# Patient Record
Sex: Female | Born: 1995 | Race: White | Hispanic: No | Marital: Single | Smoking: Never smoker
Health system: Southern US, Community
[De-identification: ages and names within clinical notes are randomized; demographics above are authoritative.]

## PROBLEM LIST (undated history)

## (undated) DIAGNOSIS — R51 Headache: Secondary | ICD-10-CM

## (undated) DIAGNOSIS — L209 Atopic dermatitis, unspecified: Secondary | ICD-10-CM

## (undated) DIAGNOSIS — R519 Headache, unspecified: Secondary | ICD-10-CM

## (undated) DIAGNOSIS — M6289 Other specified disorders of muscle: Secondary | ICD-10-CM

## (undated) DIAGNOSIS — N3010 Interstitial cystitis (chronic) without hematuria: Secondary | ICD-10-CM

## (undated) HISTORY — DX: Headache, unspecified: R51.9

## (undated) HISTORY — DX: Interstitial cystitis (chronic) without hematuria: N30.10

## (undated) HISTORY — DX: Headache: R51

## (undated) HISTORY — PX: DENTAL SURGERY: SHX609

## (undated) HISTORY — PX: NO PAST SURGERIES: SHX2092

## (undated) HISTORY — DX: Atopic dermatitis, unspecified: L20.9

---
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
## 2013-01-04 DIAGNOSIS — L209 Atopic dermatitis, unspecified: Secondary | ICD-10-CM | POA: Insufficient documentation

## 2013-07-18 DIAGNOSIS — N3010 Interstitial cystitis (chronic) without hematuria: Secondary | ICD-10-CM | POA: Insufficient documentation

## 2013-12-24 ENCOUNTER — Encounter (INDEPENDENT_AMBULATORY_CARE_PROVIDER_SITE_OTHER): Payer: Self-pay

## 2013-12-24 ENCOUNTER — Encounter: Payer: Self-pay | Admitting: Neurology

## 2013-12-24 ENCOUNTER — Ambulatory Visit (INDEPENDENT_AMBULATORY_CARE_PROVIDER_SITE_OTHER): Payer: BC Managed Care – HMO | Admitting: Neurology

## 2013-12-24 VITALS — BP 89/57 | HR 69 | Ht 61.0 in | Wt 111.2 lb

## 2013-12-24 DIAGNOSIS — G43909 Migraine, unspecified, not intractable, without status migrainosus: Secondary | ICD-10-CM | POA: Insufficient documentation

## 2013-12-24 DIAGNOSIS — G43709 Chronic migraine without aura, not intractable, without status migrainosus: Secondary | ICD-10-CM

## 2013-12-24 MED ORDER — PROPRANOLOL HCL ER 80 MG PO CP24: 80.0000 mg | ORAL_CAPSULE | Freq: Every day | ORAL | Status: DC

## 2013-12-24 NOTE — Progress Notes (Signed)
[facility]    Provider:  Dr Lucia GaskinsAhern Referring Provider: Haze Rushingobb, Deborah, MD Primary Care Physician:  Haze RushingOBB, DEBORAH, MD  CC:  Headaches  HPI:  Kayla CallerKristin Bingley is a 18 y.o. female here as a referral from Dr. Allison Quarryobb for Headaches. They are daily and worsening. Worse near the end the day. Has always had headaches since childhood, both grandmothers have headaches. Pressure and throbbing mostly in the front forehead, neck gets tense. She has knots in her shoulders. Has light sensitivity, some sounds sensitivity, some nausea, no vomiting. Feels sick after she eats. The headache can last for 6  hours. Hot packs on neck, massanging temples makes it better and ibuprofen. Taking ibuprofen daily. They can get up to 7-8/10 severe. Has to put on eye masks and darkness. Also throbbing like a tiny person trying to jump out of head. These have been going on since childhood and worsened after started period, progressively worsening. No aura. Her fingers tingle during the headache. Can fall asleep, but always tired. No snoring. No vision or hearing changes or other focal neurologic symptoms during the headaches.   Reviewed notes, labs and imaging from outside physicians, which showed: Has been told HAs were associated with sinus infections in the past and prescribed antibiotics, no weakness or vision loss, symptoms similar to previous headaches just worsening, also with scalp tenderness, no systemic symptoms like fever or chills, she has a PMHx of eczema, interstitial cystitis, chronic sinusitis. She has been prescribed a triptan in the past.   Review of Systems: Patient complains of symptoms per HPI as well as the following symptoms easy brusing, feeling hot, feeling cold, joint pain, aching muscles, allergies, birth marks, skin sensitivity, sleepiness, too much sleep, not enough energy. Pertinent negatives per HPI. All others negative.   History   Social History  . Marital Status: Unknown    Spouse Name: N/A    Number of Children: 0  . Years of Education: College   Occupational History  . waitress Other    [employer]  . cashier Other    [employer]   Social History Main Topics  . Smoking status: Never Smoker   . Smokeless tobacco: Never Used  . Alcohol Use: Yes     Comment: once a week  . Drug Use: No  . Sexual Activity: Not on file   Other Topics Concern  . Not on file   Social History Narrative   Patient lives at home with boyfriend   Caffeine use: none    Family History  Problem Relation Age of Onset  . Bipolar disorder Mother   . Other Father     back problems  . Heart Problems Father     Past Medical History  Diagnosis Date  . Atopic dermatitis   . Interstitial cystitis     Past Surgical History  Procedure Laterality Date  . No past surgeries      Current Outpatient Prescriptions  Medication Sig Dispense Refill  . cetirizine (ZYRTEC) 10 MG tablet Take 10 mg by mouth daily.      . Cholecalciferol (VITAMIN D-3) 1000 UNITS CAPS Take 1 capsule by mouth daily.      Marland Kitchen. levonorgestrel-ethinyl estradiol (NORDETTE) 0.15-30 MG-MCG tablet Take 1 tablet by mouth daily.      . mometasone (ELOCON) 0.1 % cream Apply 1 application topically daily as needed.      . propranolol ER (INDERAL LA) 80 MG 24 hr capsule Take 1 capsule (80 mg total) by  mouth daily.  30 capsule  3  . triamcinolone (KENALOG) 0.025 % ointment Apply 1 application topically 2 (two) times daily.       No current facility-administered medications for this visit.    Allergies as of 12/24/2013 - Review Complete 12/24/2013  Allergen Reaction Noted  . Amoxicillin  12/24/2013  . Latex  12/24/2013  . Penicillins  12/24/2013  . Sulfa antibiotics  12/24/2013    Vitals: BP 89/57  Pulse 69  Ht 5\' 1"  (1.549 m)  Wt 111 lb 3.2 oz (50.44 kg)  BMI 21.02 kg/m2 Last Weight:  Wt Readings from Last 1 Encounters:  12/24/13 111 lb 3.2 oz (50.44 kg)   Last Height:   Ht Readings from Last 1  Encounters:  12/24/13 5\' 1"  (1.549 m)   Physical exam: Exam: Gen: NAD, conversant, well nourised, well groomed                     CV: RRR, no MRG. No Carotid Bruits. No peripheral edema, warm, nontender Eyes: Conjunctivae clear without exudates or hemorrhage  Neuro: Detailed Neurologic Exam  Speech:    Speech is normal; fluent and spontaneous with normal comprehension.  Cognition:    The patient is oriented to person, place, and time;     recent and remote memory intact;     language fluent;     normal attention, concentration,     fund of knowledge Cranial Nerves:    The pupils are equal, round, and reactive to light. The fundi are normal and spontaneous venous pulsations are present. Visual fields are full to finger confrontation. Extraocular movements are intact. Trigeminal sensation is intact and the muscles of mastication are normal. The face is symmetric. The palate elevates in the midline. Voice is normal. Shoulder shrug is normal. The tongue has normal motion without fasciculations.   Coordination:    Normal finger to nose and heel to shin. Normal rapid alternating movements.   Gait:    Heel-toe and tandem gait are normal.   Motor Observation:    No asymmetry, no atrophy, and no involuntary movements noted. Tone:    Normal muscle tone.    Posture:    Posture is normal. normal erect    Strength:    Strength is V/V in the upper and lower limbs.      Sensation: intact     Reflex Exam:  DTR's:    Deep tendon reflexes in the upper and lower extremities are normal bilaterally.   Toes:    The toes are downgoing bilaterally.   Clonus:    Clonus is absent.       Assessment/Plan:  18 year old with chronic migraines syndrome. Will try Butterbur 75mg  bid. If that doesn't work, also prescribed propranolol 80mg  er daily and discussed side effects and to monitor BP and pulse and discussed teratogenic effects. Discussed coming back for trigger point injections as well  as botox in the future. Also suggested Riboflavin and Magnesium daily. MRI of the brain bc she has never had brain imaging. Her GI issues can be associated with migraines as gastroparesis is part of a migraine syndrome. Also discussed rebound effect, do not take ibuprofen more than 2 days a week.  Discussed following:  To prevent or relieve headaches, try the following: Cool Compress. Lie down and place a cool compress on your head.  Avoid headache triggers. If certain foods or odors seem to have triggered your migraines in the past, avoid them. A headache  diary might help you identify triggers.  Include physical activity in your daily routine. Try a daily walk or other moderate aerobic exercise.  Manage stress. Find healthy ways to cope with the stressors, such as delegating tasks on your to-do list.  Practice relaxation techniques. Try deep breathing, yoga, massage and visualization.  Eat regularly. Eating regularly scheduled meals and maintaining a healthy diet might help prevent headaches. Also, drink plenty of fluids.  Follow a regular sleep schedule. Sleep deprivation might contribute to headaches Consider biofeedback. With this mind-body technique, you learn to control certain bodily functions - such as muscle tension, heart rate and blood pressure - to prevent headaches or reduce headache pain.    Proceed to emergency room if you experience new or worsening symptoms or symptoms do not resolve, if you have new neurologic symptoms or if headache is severe, or for any concerning symptom.   Naomie DeanAntonia Orel Cooler, MD  [facility] [address] [place], [place] [id number]  Phone [phone] Fax [phone]

## 2013-12-24 NOTE — Patient Instructions (Addendum)
Overall you are doing fairly well but I do want to suggest a few things today:   Remember to drink plenty of fluid, eat healthy meals and do not skip any meals. Try to eat protein with a every meal and eat a healthy snack such as fruit or nuts in between meals. Try to keep a regular sleep-wake schedule and try to exercise daily, particularly in the form of walking, 20-30 minutes a day, if you can.   As far as your medications are concerned, I would like to suggest; propranolol 80mg  daily. Monitor blood pressure, pulse, stop for dizziness or any other severe side effect. Do not get pregnant while taking medications until discussing with physician. Butterbur 75mg  BID.   As far as diagnostic testing: MRi of the brain  I would like to see you back in 3 months, sooner if we need to. Please call us with any interim questions, concerns, problems, updates or refill requests.   Please also call us for any test results so we can go over those with you on the phone.  My clinical assistant and will answer any of your questions and relay your messages to me and also relay most of my messages to you.   Our phone number is [phone]. We also have an after hours call service for urgent matters and there is a physician on-call for urgent questions. For any emergencies you know to call 911 or go to the nearest emergency room

## 2013-12-31 ENCOUNTER — Ambulatory Visit: Payer: BC Managed Care – PPO | Attending: Obstetrics & Gynecology | Admitting: Physical Therapy

## 2013-12-31 DIAGNOSIS — N941 Dyspareunia: Secondary | ICD-10-CM | POA: Diagnosis not present

## 2013-12-31 DIAGNOSIS — M62838 Other muscle spasm: Secondary | ICD-10-CM | POA: Diagnosis not present

## 2013-12-31 DIAGNOSIS — R102 Pelvic and perineal pain: Secondary | ICD-10-CM | POA: Diagnosis not present

## 2013-12-31 DIAGNOSIS — Z5189 Encounter for other specified aftercare: Secondary | ICD-10-CM | POA: Diagnosis present

## 2013-12-31 DIAGNOSIS — L209 Atopic dermatitis, unspecified: Secondary | ICD-10-CM | POA: Insufficient documentation

## 2013-12-31 DIAGNOSIS — N3010 Interstitial cystitis (chronic) without hematuria: Secondary | ICD-10-CM | POA: Diagnosis not present

## 2014-01-01 ENCOUNTER — Ambulatory Visit: Payer: BC Managed Care – PPO | Admitting: Physical Therapy

## 2014-01-01 DIAGNOSIS — Z5189 Encounter for other specified aftercare: Secondary | ICD-10-CM | POA: Diagnosis not present

## 2014-01-07 ENCOUNTER — Ambulatory Visit: Payer: BC Managed Care – PPO | Admitting: Physical Therapy

## 2014-01-07 DIAGNOSIS — Z5189 Encounter for other specified aftercare: Secondary | ICD-10-CM | POA: Diagnosis not present

## 2014-01-11 ENCOUNTER — Ambulatory Visit: Payer: BC Managed Care – PPO | Admitting: Physical Therapy

## 2014-01-11 DIAGNOSIS — Z5189 Encounter for other specified aftercare: Secondary | ICD-10-CM | POA: Diagnosis not present

## 2014-01-14 ENCOUNTER — Ambulatory Visit: Payer: BC Managed Care – PPO | Attending: Obstetrics & Gynecology | Admitting: Physical Therapy

## 2014-01-14 DIAGNOSIS — R102 Pelvic and perineal pain: Secondary | ICD-10-CM | POA: Diagnosis not present

## 2014-01-14 DIAGNOSIS — L209 Atopic dermatitis, unspecified: Secondary | ICD-10-CM | POA: Diagnosis not present

## 2014-01-14 DIAGNOSIS — N941 Dyspareunia: Secondary | ICD-10-CM | POA: Insufficient documentation

## 2014-01-14 DIAGNOSIS — M62838 Other muscle spasm: Secondary | ICD-10-CM | POA: Insufficient documentation

## 2014-01-14 DIAGNOSIS — N3010 Interstitial cystitis (chronic) without hematuria: Secondary | ICD-10-CM | POA: Insufficient documentation

## 2014-01-14 DIAGNOSIS — Z5189 Encounter for other specified aftercare: Secondary | ICD-10-CM | POA: Insufficient documentation

## 2014-01-15 ENCOUNTER — Ambulatory Visit: Payer: BC Managed Care – PPO | Admitting: Physical Therapy

## 2014-01-15 DIAGNOSIS — Z5189 Encounter for other specified aftercare: Secondary | ICD-10-CM | POA: Diagnosis not present

## 2014-01-21 ENCOUNTER — Ambulatory Visit: Payer: BC Managed Care – PPO | Admitting: Physical Therapy

## 2014-01-23 ENCOUNTER — Ambulatory Visit: Payer: BC Managed Care – PPO | Admitting: Physical Therapy

## 2014-01-23 DIAGNOSIS — Z5189 Encounter for other specified aftercare: Secondary | ICD-10-CM | POA: Diagnosis not present

## 2014-01-28 ENCOUNTER — Ambulatory Visit: Payer: BC Managed Care – PPO | Admitting: Physical Therapy

## 2014-01-28 DIAGNOSIS — Z5189 Encounter for other specified aftercare: Secondary | ICD-10-CM | POA: Diagnosis not present

## 2014-01-31 ENCOUNTER — Ambulatory Visit: Payer: BC Managed Care – PPO | Admitting: Physical Therapy

## 2014-02-01 ENCOUNTER — Ambulatory Visit: Payer: BC Managed Care – PPO | Admitting: Physical Therapy

## 2014-02-01 DIAGNOSIS — Z5189 Encounter for other specified aftercare: Secondary | ICD-10-CM | POA: Diagnosis not present

## 2014-02-04 ENCOUNTER — Ambulatory Visit: Payer: BC Managed Care – PPO | Admitting: Physical Therapy

## 2014-02-04 DIAGNOSIS — Z5189 Encounter for other specified aftercare: Secondary | ICD-10-CM | POA: Diagnosis not present

## 2014-02-14 ENCOUNTER — Ambulatory Visit: Payer: BC Managed Care – PPO | Attending: Obstetrics & Gynecology | Admitting: Physical Therapy

## 2014-02-14 DIAGNOSIS — N941 Dyspareunia: Secondary | ICD-10-CM | POA: Insufficient documentation

## 2014-02-14 DIAGNOSIS — R102 Pelvic and perineal pain: Secondary | ICD-10-CM | POA: Insufficient documentation

## 2014-02-14 DIAGNOSIS — M62838 Other muscle spasm: Secondary | ICD-10-CM | POA: Diagnosis not present

## 2014-02-14 DIAGNOSIS — L209 Atopic dermatitis, unspecified: Secondary | ICD-10-CM | POA: Insufficient documentation

## 2014-02-14 DIAGNOSIS — Z5189 Encounter for other specified aftercare: Secondary | ICD-10-CM | POA: Insufficient documentation

## 2014-02-14 DIAGNOSIS — N3010 Interstitial cystitis (chronic) without hematuria: Secondary | ICD-10-CM | POA: Insufficient documentation

## 2014-02-18 ENCOUNTER — Ambulatory Visit: Payer: BC Managed Care – PPO | Admitting: Physical Therapy

## 2014-02-22 ENCOUNTER — Ambulatory Visit: Payer: BC Managed Care – PPO | Admitting: Physical Therapy

## 2014-02-22 DIAGNOSIS — Z5189 Encounter for other specified aftercare: Secondary | ICD-10-CM | POA: Diagnosis not present

## 2014-02-27 ENCOUNTER — Ambulatory Visit: Payer: BC Managed Care – PPO | Admitting: Physical Therapy

## 2014-02-27 DIAGNOSIS — Z5189 Encounter for other specified aftercare: Secondary | ICD-10-CM | POA: Diagnosis not present

## 2014-03-06 ENCOUNTER — Ambulatory Visit: Payer: BC Managed Care – PPO | Admitting: Physical Therapy

## 2014-03-06 DIAGNOSIS — Z5189 Encounter for other specified aftercare: Secondary | ICD-10-CM | POA: Diagnosis not present

## 2014-03-11 ENCOUNTER — Ambulatory Visit: Payer: BC Managed Care – PPO | Admitting: Physical Therapy

## 2014-03-11 DIAGNOSIS — Z5189 Encounter for other specified aftercare: Secondary | ICD-10-CM | POA: Diagnosis not present

## 2014-03-19 ENCOUNTER — Ambulatory Visit: Payer: BLUE CROSS/BLUE SHIELD | Attending: Obstetrics & Gynecology | Admitting: Physical Therapy

## 2014-03-19 DIAGNOSIS — Z5189 Encounter for other specified aftercare: Secondary | ICD-10-CM | POA: Insufficient documentation

## 2014-03-19 DIAGNOSIS — N941 Dyspareunia: Secondary | ICD-10-CM | POA: Diagnosis not present

## 2014-03-19 DIAGNOSIS — L209 Atopic dermatitis, unspecified: Secondary | ICD-10-CM | POA: Insufficient documentation

## 2014-03-19 DIAGNOSIS — N3010 Interstitial cystitis (chronic) without hematuria: Secondary | ICD-10-CM | POA: Insufficient documentation

## 2014-03-19 DIAGNOSIS — R102 Pelvic and perineal pain: Secondary | ICD-10-CM | POA: Diagnosis not present

## 2014-03-19 DIAGNOSIS — M62838 Other muscle spasm: Secondary | ICD-10-CM | POA: Insufficient documentation

## 2014-03-26 ENCOUNTER — Ambulatory Visit: Payer: BLUE CROSS/BLUE SHIELD | Admitting: Physical Therapy

## 2014-03-26 ENCOUNTER — Ambulatory Visit: Payer: BC Managed Care – HMO | Admitting: Neurology

## 2014-03-29 ENCOUNTER — Ambulatory Visit: Payer: BLUE CROSS/BLUE SHIELD | Admitting: Physical Therapy

## 2014-03-29 DIAGNOSIS — Z5189 Encounter for other specified aftercare: Secondary | ICD-10-CM | POA: Diagnosis not present

## 2014-04-02 ENCOUNTER — Ambulatory Visit: Payer: BLUE CROSS/BLUE SHIELD | Admitting: Physical Therapy

## 2014-04-02 DIAGNOSIS — Z5189 Encounter for other specified aftercare: Secondary | ICD-10-CM | POA: Diagnosis not present

## 2014-04-11 ENCOUNTER — Ambulatory Visit: Payer: BLUE CROSS/BLUE SHIELD | Admitting: Physical Therapy

## 2014-04-11 DIAGNOSIS — Z5189 Encounter for other specified aftercare: Secondary | ICD-10-CM | POA: Diagnosis not present

## 2014-04-16 ENCOUNTER — Ambulatory Visit: Payer: BLUE CROSS/BLUE SHIELD | Attending: Obstetrics & Gynecology | Admitting: Physical Therapy

## 2014-04-16 DIAGNOSIS — N3010 Interstitial cystitis (chronic) without hematuria: Secondary | ICD-10-CM | POA: Insufficient documentation

## 2014-04-16 DIAGNOSIS — L209 Atopic dermatitis, unspecified: Secondary | ICD-10-CM | POA: Insufficient documentation

## 2014-04-16 DIAGNOSIS — M62838 Other muscle spasm: Secondary | ICD-10-CM | POA: Insufficient documentation

## 2014-04-16 DIAGNOSIS — R102 Pelvic and perineal pain: Secondary | ICD-10-CM | POA: Insufficient documentation

## 2014-04-16 DIAGNOSIS — N941 Dyspareunia: Secondary | ICD-10-CM | POA: Insufficient documentation

## 2014-04-16 DIAGNOSIS — Z5189 Encounter for other specified aftercare: Secondary | ICD-10-CM | POA: Insufficient documentation

## 2014-04-24 ENCOUNTER — Ambulatory Visit: Payer: BLUE CROSS/BLUE SHIELD | Admitting: Physical Therapy

## 2014-04-24 DIAGNOSIS — Z5189 Encounter for other specified aftercare: Secondary | ICD-10-CM | POA: Diagnosis not present

## 2014-04-24 DIAGNOSIS — M62838 Other muscle spasm: Secondary | ICD-10-CM | POA: Diagnosis not present

## 2014-04-24 DIAGNOSIS — R102 Pelvic and perineal pain: Secondary | ICD-10-CM | POA: Diagnosis not present

## 2014-04-24 DIAGNOSIS — N3010 Interstitial cystitis (chronic) without hematuria: Secondary | ICD-10-CM | POA: Diagnosis not present

## 2014-04-24 DIAGNOSIS — L209 Atopic dermatitis, unspecified: Secondary | ICD-10-CM | POA: Diagnosis not present

## 2014-04-24 DIAGNOSIS — N941 Dyspareunia: Secondary | ICD-10-CM | POA: Diagnosis not present

## 2014-05-10 ENCOUNTER — Ambulatory Visit: Payer: BLUE CROSS/BLUE SHIELD | Admitting: Physical Therapy

## 2014-05-17 ENCOUNTER — Ambulatory Visit: Payer: BLUE CROSS/BLUE SHIELD | Attending: Obstetrics & Gynecology | Admitting: Physical Therapy

## 2014-05-17 ENCOUNTER — Encounter: Payer: Self-pay | Admitting: Physical Therapy

## 2014-05-17 DIAGNOSIS — N941 Dyspareunia: Secondary | ICD-10-CM | POA: Diagnosis not present

## 2014-05-17 DIAGNOSIS — R102 Pelvic and perineal pain: Secondary | ICD-10-CM | POA: Diagnosis not present

## 2014-05-17 DIAGNOSIS — L209 Atopic dermatitis, unspecified: Secondary | ICD-10-CM | POA: Insufficient documentation

## 2014-05-17 DIAGNOSIS — N3010 Interstitial cystitis (chronic) without hematuria: Secondary | ICD-10-CM | POA: Diagnosis not present

## 2014-05-17 DIAGNOSIS — M62838 Other muscle spasm: Secondary | ICD-10-CM

## 2014-05-17 DIAGNOSIS — Z5189 Encounter for other specified aftercare: Secondary | ICD-10-CM | POA: Insufficient documentation

## 2014-05-17 DIAGNOSIS — M25551 Pain in right hip: Secondary | ICD-10-CM

## 2014-05-17 NOTE — Therapy (Signed)
[facility] [address], [address] [place], [place], [address] Phone: [phone]   Fax:  [phone]  Physical Therapy Treatment  Patient Details  Name: Kayla Gamble MRN: [id number] Date of Birth: 1995/04/10 Referring Provider:  Mitchel Honour, DO  Encounter Date: 05/17/2014      PT End of Session - 05/17/14 0913    Visit Number 10   Date for PT Re-Evaluation 05/20/14   PT Start Time 0800   PT Stop Time 0900   PT Time Calculation (min) 60 min   Activity Tolerance Patient tolerated treatment well   Behavior During Therapy [facility] for tasks assessed/performed      Past Medical History  Diagnosis Date  . Atopic dermatitis   . Interstitial cystitis   . Headache     Past Surgical History  Procedure Laterality Date  . No past surgeries      There were no vitals taken for this visit.  Visit Diagnosis:  Pain in joint, pelvic region and thigh, right  Muscle spasm      Subjective Assessment - 05/17/14 0812    Symptoms Patient reports she has not had pain in 2 weeks.  Patient was able to have have intercourse without pain.  Patient has stopped her birth contol and has not had migraines or pelvic pain since then. Biggest size dilator.    How long can you sit comfortably? no pain   How long can you stand comfortably? no pain   How long can you walk comfortably? no pain   Patient Stated Goals reduce or abolish pain   Currently in Pain? No/denies   Multiple Pain Sites No          [facility] PT Assessment - 05/17/14 0001    Assessment   Medical Diagnosis Dyspareunia; Pelvic pain   Onset Date 03/15/13   Prior Therapy None   Precautions   Precautions None   Balance Screen   Has the patient fallen in the past 6 months No   Has the patient had a decrease in activity level because of a fear of falling?  No   Is the patient reluctant to leave their home because of a fear of falling?  No   Observation/Other Assessments    Other Surveys  Select  Female NIH-CPSI score for pain initially 14 pts. today is 0    Strength   Left Hip ABduction 4/5   Left Hip ADduction 4/5   Palpation   Palpation pelvis in correct alignment.                   [facility] Adult PT Treatment/Exercise - 05/17/14 0001    Balance Poses: Yoga   Warrior I 2 reps   Lumbar Exercises: Stretches   Quadruped Mid Back Stretch 3 reps  cat/camet   Knee/Hip Exercises: Stretches   Active Hamstring Stretch 1 rep;30 seconds  hands on wall with flat back and knees extended   Passive Hamstring Stretch 1 rep;30 seconds  on back with legs on wall, hamstring, then adductor stretch   Piriformis Stretch 1 rep;30 seconds   Piriformis Stretch Limitations --  pirformed in sitting with trunk rotation   Knee/Hip Exercises: Sidelying   Hip ADduction AROM;Right;Left;1 set;5 sets   Hip ADduction Limitations --  verbal cues for position and no hip flexion      See patient instruction for physio ball exercises that were given to patient.            PT  Education - 05/17/14 0912    Education provided Yes   Education Details Physio ball stretches   Person(s) Educated Patient   Methods Explanation;Demonstration;Verbal cues;Handout   Comprehension Verbalized understanding;Returned demonstration          PT Short Term Goals - 05/17/14 [id number]    PT SHORT TERM GOAL #1   Title be independent with initial HEP for stretching within pain tolerance   Time 4   Period Weeks   Status Achieved   PT SHORT TERM GOAL #2   Title understand ho to use a Q-tip to insert into vaginal with reduced pain   Time 4   Period Weeks   Status Achieved   PT SHORT TERM GOAL #3   Title understand how to perform diaphragmatic breathing to relax pelvic floor   Time 4   Period Weeks   Status Achieved   PT SHORT TERM GOAL #4   Title perform self perineal massage to improve mobility of pelvic floor muscles   Time 4   Period Weeks   Status Achieved            PT Long Term Goals - 05/17/14 0820    PT LONG TERM GOAL #1   Title demonstate and /or verbalize techniques to reduce the risk of re-injury to include info on posture   Time 8   Period Weeks   Status Achieved   PT LONG TERM GOAL #2   Title be independent withadvanced HEP for pelvic floor relaxtion   Time 8   Period Weeks   Status Achieved   PT LONG TERM GOAL #3   Title Marinoff scale  is </= 1/3   Time 8   Period Weeks   Status Achieved  able to have intercourse   PT LONG TERM GOAL #4   Title return to yoga with minimal pelvic pain   Time 8   Period Weeks   Status Achieved   PT LONG TERM GOAL #5   Title wear tampon with minimal pian   Time 8   Period Weeks   Status Achieved               Plan - 05/17/14 0913    Clinical Impression Statement Patient has achieved all goals and no pain since she stopped taking her birthcontrol pills. Patient continues to have weakness in bilateral hip adductors and abductors.    Pt will benefit from skilled therapeutic intervention in order to improve on the following deficits Pain;Decreased strength;Decreased mobility;Decreased endurance;Decreased coordination;Impaired flexibility   Rehab Potential Good   Clinical Impairments Affecting Rehab Potential None   PT Frequency 2x / week   PT Duration 8 weeks   PT Treatment/Interventions Moist Heat;Therapeutic activities;Patient/family education;Therapeutic exercise;Biofeedback;Ultrasound;Manual techniques;Neuromuscular re-education;Cryotherapy;Electrical Stimulation;Functional mobility training   PT Next Visit Plan if patient continues to feel well then discharge in 2 weeks.   Recommended Other Services None   Consulted and Agree with Plan of Care Patient        Problem List Patient Active Problem List   Diagnosis Date Noted  . Migraine 12/24/2013    Travelle Mcclimans, PT 05/17/2014, 9:18 AM  [facility] [address] [place], [place], [address] Phone: [phone]   Fax:  [phone]

## 2014-05-17 NOTE — Patient Instructions (Signed)
Ball Roll (Prone)   Lie with abdomen on ball in a squatting position.  Straighten legs partially and roll forward. Repeat 10___ times. Do __1_ times a day.   Copyright  VHI. All rights reserved.  Leg Pull (Supine)   Lie flat with calves supported by ball. Squeeze pelvic floor and hold. Pull ball in, then push away. Relax. Repeat _10__ times. Do _1__ times a day. Advanced: Hold squeeze through all repetitions.    Copyright  VHI. All rights reserved.  Diagonal (Sitting)   Sit on ball.  Guiding with legs, move ball diagonally to left side. Relax. Hold 15 seconds Repeat _2__ times. Do ___ 1times a day. Alternate: Form a "V" by moving diagonal to one side then to other. Advanced: Continue to hold squeeze through repetitions.   Copyright  VHI. All rights reserved.  Side Tilt (Sitting)   Sit on ball.  Roll ball slightly to one side by tilting pelvis the opposite direction. Repeat to other side. Relax. Repeat 10___ times. Do _1__ times a day. Advanced: Continue to hold squeeze through repetitions.  Copyright  VHI. All rights reserved.  Bouncing With Bracing (Sitting)   Sit on ball. Find neutral spine.  Gently bounce up and down for _1__ seconds. Repeat _10__ times. Do __1_ times a day.  Copyright  VHI. All rights reserved.  Patient able to return demonstration correctly for the above exercises.

## 2014-05-18 IMAGING — US US ABDOMEN COMPLETE
1 series · 14 of 25 positions shown · non-contrast
Comparison: None.

CLINICAL DATA: Right upper quadrant abdominal pain. History of
cystitis. Initial encounter.

EXAM:
ULTRASOUND ABDOMEN COMPLETE

[Series 1: us abdomen complete · 0.26mm/px · 14 of 47 slices shown]
[im 1/47]
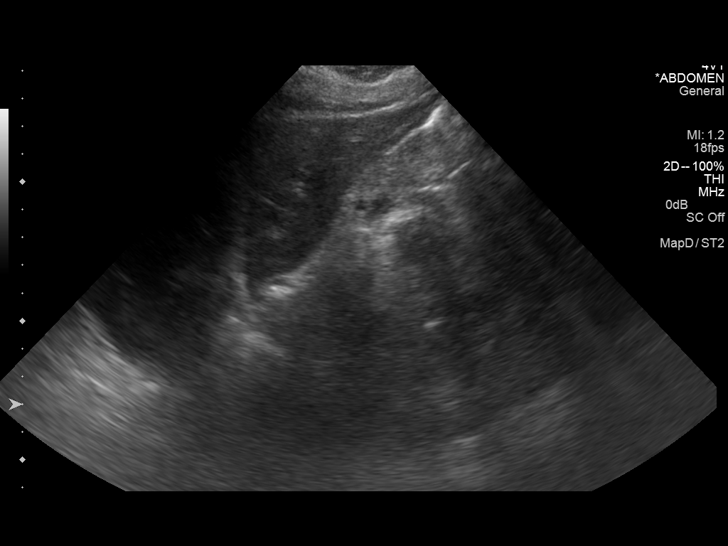
[im 4/47]
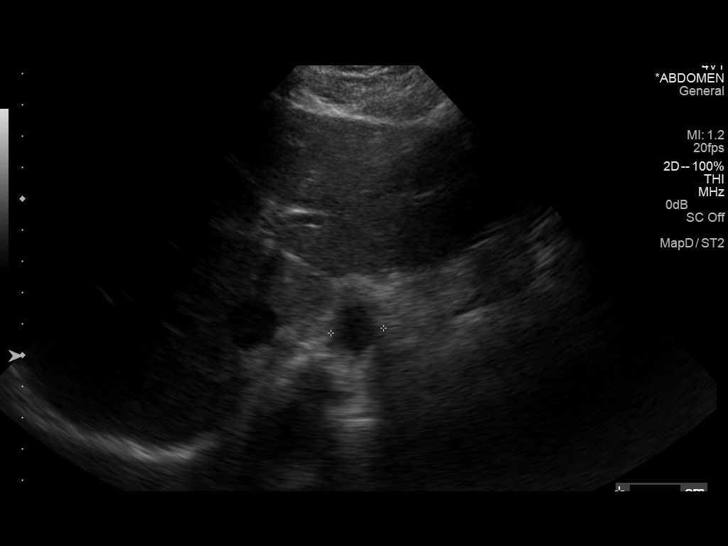
[im 8/47]
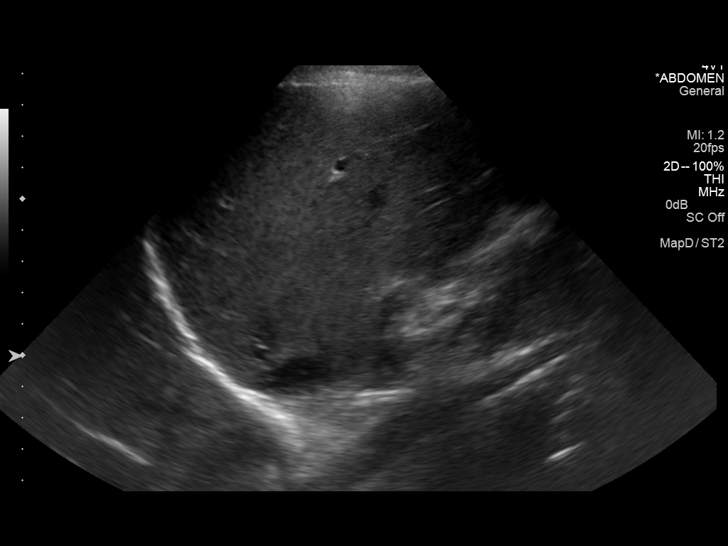
[im 12/47]
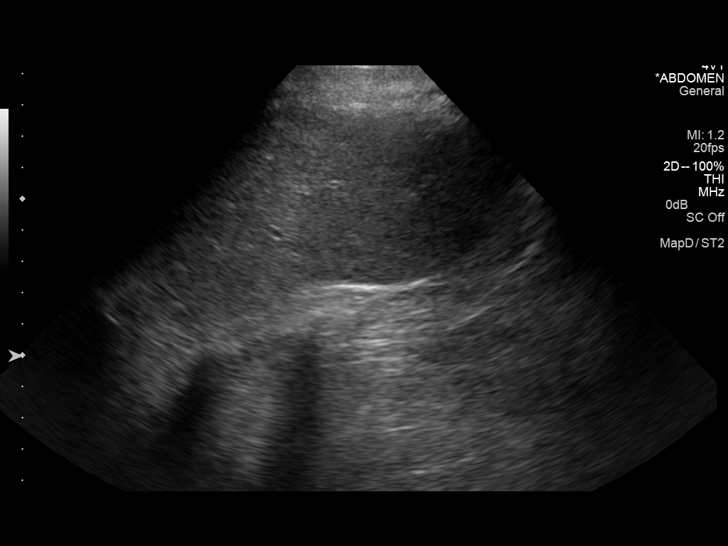
[im 16/47]
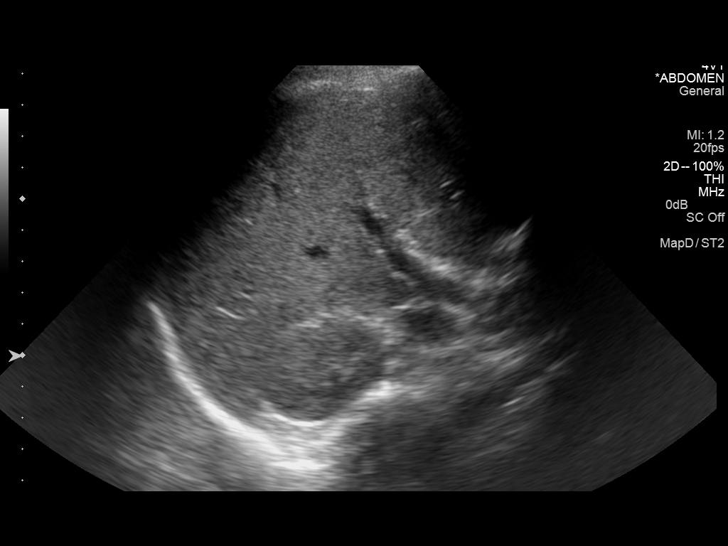
[im 18/47]
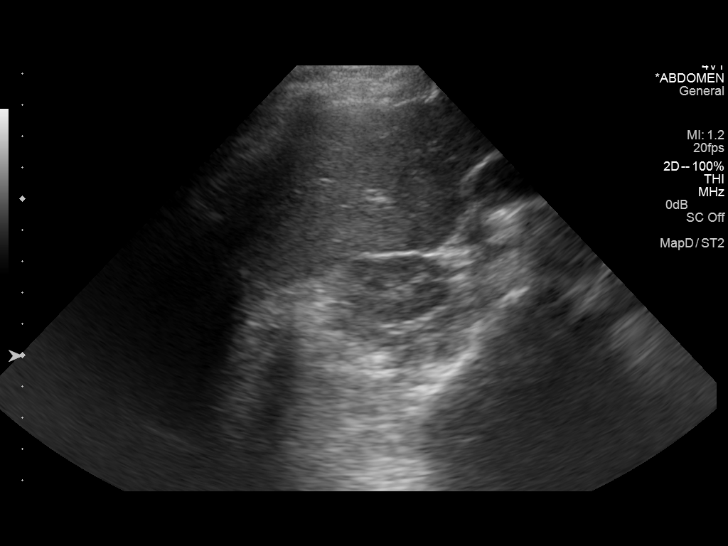
[im 22/47]
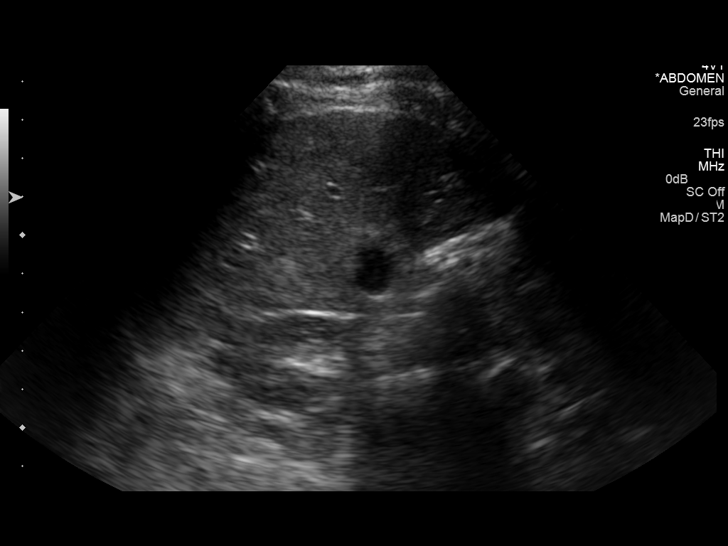
[im 25/47]
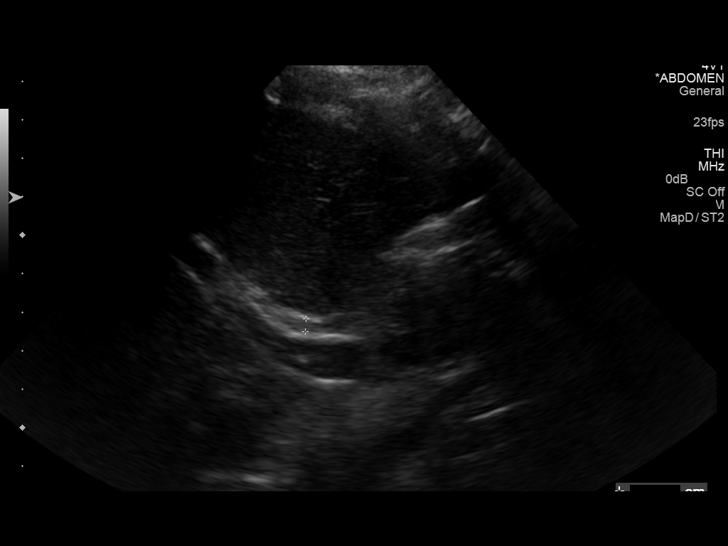
[im 29/47]
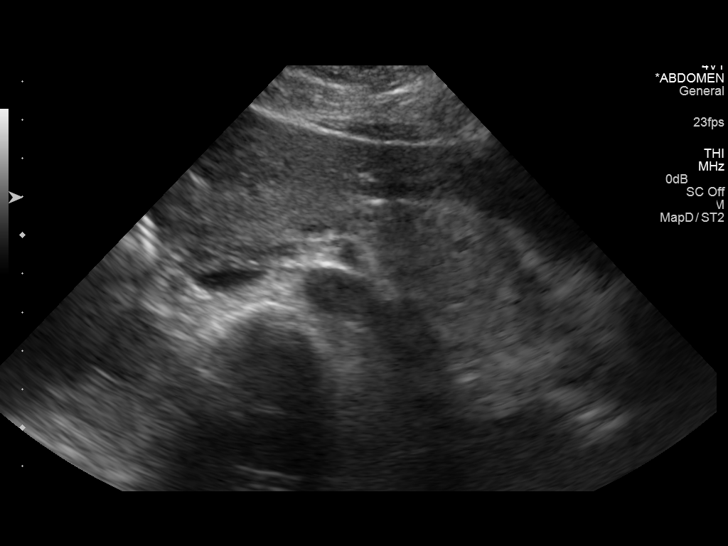
[im 31/47]
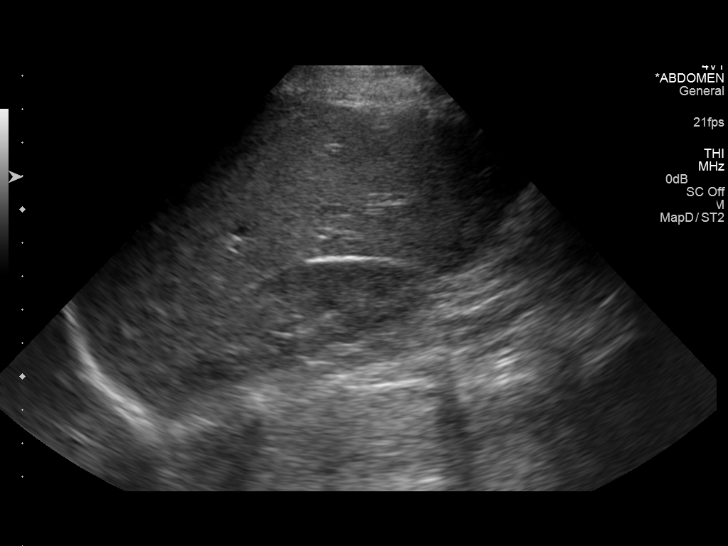
[im 35/47]
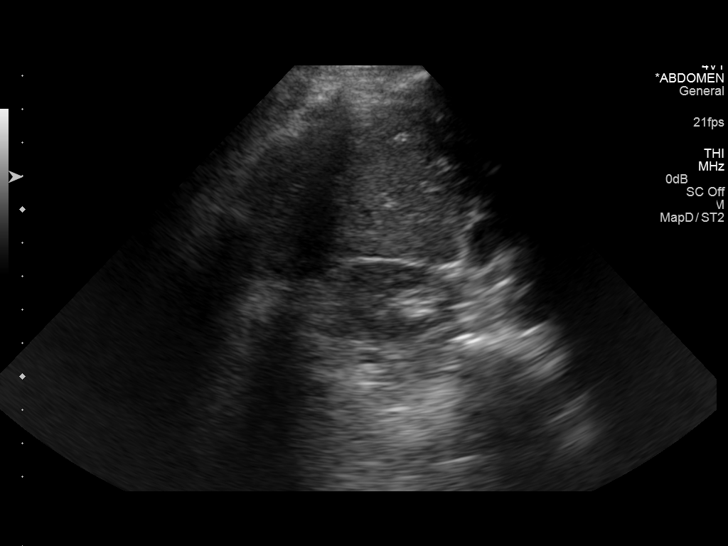
[im 39/47]
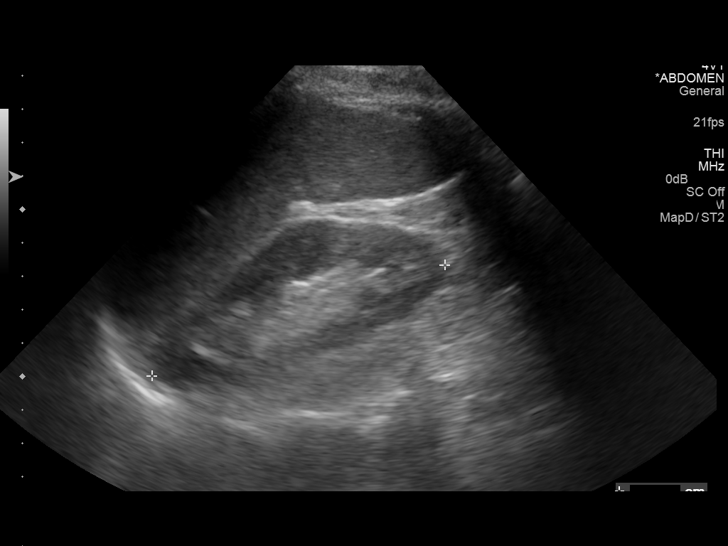
[im 43/47]
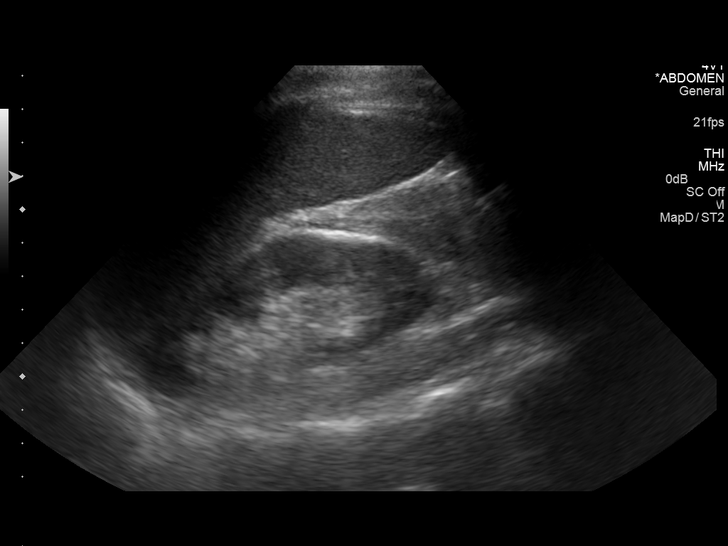
[im 47/47]
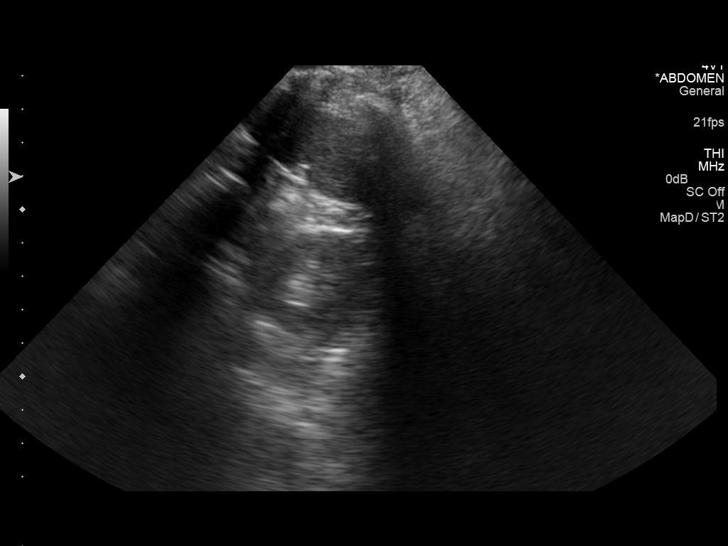

[14 of 25 positions shown; findings below may reference images not displayed]

FINDINGS: Gallbladder: No gallstones or wall thickening visualized. No
sonographic Murphy sign noted.

Common bile duct: Diameter: 3.3 mm.

Liver: No focal lesion identified. Within normal limits in
parenchymal echogenicity.

IVC: No abnormality visualized.

Pancreas: Visualized portion unremarkable.

Spleen: Size and appearance within normal limits.

Right Kidney: Length: 9.5 cm. Echogenicity within normal limits. No
mass or hydronephrosis visualized.

Left Kidney: Length: 9.4 cm. Echogenicity within normal limits. No
mass or hydronephrosis visualized.

Abdominal aorta: No aneurysm visualized.

Other findings: None.
IMPRESSION: No acute or significant abdominal findings. No evidence of
cholecystitis.

## 2014-05-28 NOTE — Therapy (Signed)
[facility] [address], [place], [place], [address] Phone: [phone]   Fax:  [phone]  Patient Details  Name: Kayla Gamble MRN: [id number] Date of Birth: 05-Jun-1995 Referring Provider:  Linda Hedges, DO  Encounter Date: 05/17/2014 PHYSICAL THERAPY DISCHARGE SUMMARY  Visits from Start of Care: 11  Current functional level related to goals / functional outcomes: Patient is able to have intercourse without pain.  Patient is able to work without pain.  Patient is doing yoga to continue pelvic floor relaxation.  Patient is independent with pelvic floor soft tissue work to manage any muscles spasms.  Patient is independent with her HEP. Patient is using her Home TENS unit to manage her pain. Patient called on 05/28/2014 to cancel all appointments due to her not having pain and is ready for discharge.    Remaining deficits: None   Education / Equipment: HEP Plan: Patient agrees to discharge.  Patient goals were met. Patient is being discharged due to meeting the stated rehab goals.  Thank you for the referral. ?????      Jacoya Bauman, PT 05/28/2014, 11:49 AM  [facility] [address], [place], [place], [address] Phone: [phone]   Fax:  [phone]

## 2014-05-30 ENCOUNTER — Ambulatory Visit: Payer: BLUE CROSS/BLUE SHIELD | Admitting: Physical Therapy

## 2014-08-15 ENCOUNTER — Emergency Department (HOSPITAL_COMMUNITY): Payer: BLUE CROSS/BLUE SHIELD

## 2014-08-15 ENCOUNTER — Encounter (HOSPITAL_COMMUNITY): Payer: Self-pay

## 2014-08-15 ENCOUNTER — Emergency Department (HOSPITAL_COMMUNITY)
Admission: EM | Admit: 2014-08-15 | Discharge: 2014-08-15 | Disposition: A | Discharge status: Home / Self Care | Payer: BLUE CROSS/BLUE SHIELD | Attending: Emergency Medicine | Admitting: Emergency Medicine

## 2014-08-15 DIAGNOSIS — Z79899 Other long term (current) drug therapy: Secondary | ICD-10-CM | POA: Insufficient documentation

## 2014-08-15 DIAGNOSIS — R1011 Right upper quadrant pain: Secondary | ICD-10-CM

## 2014-08-15 DIAGNOSIS — R1013 Epigastric pain: Secondary | ICD-10-CM | POA: Diagnosis present

## 2014-08-15 DIAGNOSIS — Z91040 Latex allergy status: Secondary | ICD-10-CM | POA: Insufficient documentation

## 2014-08-15 DIAGNOSIS — Z872 Personal history of diseases of the skin and subcutaneous tissue: Secondary | ICD-10-CM | POA: Insufficient documentation

## 2014-08-15 DIAGNOSIS — Z7952 Long term (current) use of systemic steroids: Secondary | ICD-10-CM | POA: Diagnosis not present

## 2014-08-15 DIAGNOSIS — Z87448 Personal history of other diseases of urinary system: Secondary | ICD-10-CM | POA: Diagnosis not present

## 2014-08-15 DIAGNOSIS — Z880 Allergy status to penicillin: Secondary | ICD-10-CM | POA: Insufficient documentation

## 2014-08-15 DIAGNOSIS — Z3202 Encounter for pregnancy test, result negative: Secondary | ICD-10-CM | POA: Diagnosis not present

## 2014-08-15 HISTORY — DX: Other specified disorders of muscle: M62.89

## 2014-08-15 LAB — COMPREHENSIVE METABOLIC PANEL
ALT: 20 U/L (ref 14–54)
ANION GAP: 10 (ref 5–15)
AST: 22 U/L (ref 15–41)
Albumin: 4.5 g/dL (ref 3.5–5.0)
Alkaline Phosphatase: 51 U/L (ref 38–126)
BILIRUBIN TOTAL: 0.5 mg/dL (ref 0.3–1.2)
BUN: 15 mg/dL (ref 6–20)
CHLORIDE: 106 mmol/L (ref 101–111)
CO2: 25 mmol/L (ref 22–32)
CREATININE: 0.74 mg/dL (ref 0.44–1.00)
Calcium: 9.3 mg/dL (ref 8.9–10.3)
GFR calc Af Amer: >60 mL/min (ref >60)
GFR calc non Af Amer: >60 mL/min (ref >60)
Glucose, Bld: 68 mg/dL (ref 65–99)
Potassium: 3.4 mmol/L — ABNORMAL LOW (ref 3.5–5.1)
SODIUM: 141 mmol/L (ref 135–145)
TOTAL PROTEIN: 7.9 g/dL (ref 6.5–8.1)

## 2014-08-15 LAB — POC URINE PREG, ED: Preg Test, Ur: NEGATIVE

## 2014-08-15 LAB — URINALYSIS, ROUTINE W REFLEX MICROSCOPIC
Bilirubin Urine: NEGATIVE
GLUCOSE, UA: NEGATIVE mg/dL
Hgb urine dipstick: NEGATIVE
KETONES UR: NEGATIVE mg/dL
Leukocytes, UA: NEGATIVE
Nitrite: NEGATIVE
Protein, ur: NEGATIVE mg/dL
Specific Gravity, Urine: 1.004 — ABNORMAL LOW (ref 1.005–1.030)
UROBILINOGEN UA: 0.2 mg/dL (ref 0.0–1.0)
pH: 6.5 (ref 5.0–8.0)

## 2014-08-15 LAB — LIPASE, BLOOD: Lipase: 30 U/L (ref 22–51)

## 2014-08-15 LAB — CBC WITH DIFFERENTIAL/PLATELET
BASOS ABS: 0 10*3/uL (ref 0.0–0.1)
BASOS PCT: 0 % (ref 0–1)
EOS PCT: 2 % (ref 0–5)
Eosinophils Absolute: 0.2 10*3/uL (ref 0.0–0.7)
HCT: 41.4 % (ref 36.0–46.0)
HEMOGLOBIN: 13.9 g/dL (ref 12.0–15.0)
Lymphocytes Relative: 25 % (ref 12–46)
Lymphs Abs: 2.1 10*3/uL (ref 0.7–4.0)
MCH: 27.6 pg (ref 26.0–34.0)
MCHC: 33.6 g/dL (ref 30.0–36.0)
MCV: 82.3 fL (ref 78.0–100.0)
MONOS PCT: 11 % (ref 3–12)
Monocytes Absolute: 0.9 10*3/uL (ref 0.1–1.0)
Neutro Abs: 5.3 10*3/uL (ref 1.7–7.7)
Neutrophils Relative %: 62 % (ref 43–77)
Platelets: 200 10*3/uL (ref 150–400)
RBC: 5.03 MIL/uL (ref 3.87–5.11)
RDW: 12.2 % (ref 11.5–15.5)
WBC: 8.4 10*3/uL (ref 4.0–10.5)

## 2014-08-15 MED ORDER — PANTOPRAZOLE SODIUM 40 MG IV SOLR: 40.0000 mg | INTRAVENOUS | Status: DC

## 2014-08-15 MED ORDER — PANTOPRAZOLE SODIUM 40 MG PO TBEC: 40.0000 mg | DELAYED_RELEASE_TABLET | Freq: Every day | ORAL | Status: DC

## 2014-08-15 MED ORDER — ONDANSETRON HCL 4 MG/2ML IJ SOLN
4.0000 mg | Freq: Once | INTRAMUSCULAR | Status: AC
  Administered 2014-08-15: 4 mg via INTRAVENOUS
  Filled 2014-08-15: qty 2

## 2014-08-15 MED ORDER — MORPHINE SULFATE 4 MG/ML IJ SOLN: 4.0000 mg | Freq: Once | INTRAMUSCULAR | Status: DC

## 2014-08-15 MED ORDER — MORPHINE SULFATE 4 MG/ML IJ SOLN
2.0000 mg | Freq: Once | INTRAMUSCULAR | Status: AC
  Administered 2014-08-15: 2 mg via INTRAVENOUS
  Filled 2014-08-15: qty 1

## 2014-08-15 MED ORDER — ONDANSETRON 4 MG PO TBDP: 4.0000 mg | ORAL_TABLET | Freq: Three times a day (TID) | ORAL | Status: DC | PRN

## 2014-08-15 MED ORDER — HYDROCODONE-ACETAMINOPHEN 5-325 MG PO TABS: 2.0000 | ORAL_TABLET | ORAL | Status: DC | PRN

## 2014-08-15 NOTE — Discharge Instructions (Signed)
Take Protonix daily. Take vicodin as needed for pain. Take zofran as needed for nausea. Follow up with your doctor for further evaluation.

## 2014-08-15 NOTE — ED Notes (Signed)
Nurse currently drawing lab..Marland Kitchen

## 2014-08-15 NOTE — ED Notes (Signed)
Pt attempting urine sample at present time; will draw labs with pt return.

## 2014-08-15 NOTE — ED Notes (Signed)
Pt reports intermittent RLQ pain for 2 weeks; denies urinary symptoms.

## 2014-08-15 NOTE — ED Provider Notes (Signed)
CSN: [id number]     Arrival date & time 08/15/14  1607 History   First MD Initiated Contact with Patient 08/15/14 1648     Chief Complaint  Patient presents with  . Abdominal Pain  . Back Pain     (Consider location/radiation/quality/duration/timing/severity/associated sxs/prior Treatment) Patient is a 19 y.o. female presenting with abdominal pain and back pain. The history is provided by the patient. No language interpreter was used.  Abdominal Pain Pain location:  Epigastric Pain quality: aching   Pain radiates to:  Does not radiate Pain severity:  Moderate Onset quality:  Gradual Duration:  2 weeks Timing:  Constant Progression:  Waxing and waning Chronicity:  New Context: eating   Relieved by:  Nothing Worsened by:  Nothing tried Ineffective treatments:  None tried Associated symptoms: no chest pain, no chills, no diarrhea, no dysuria, no fatigue, no fever, no nausea, no shortness of breath and no vomiting   Risk factors: no aspirin use, not elderly, has not had multiple surgeries, no NSAID use, not obese, not pregnant and no recent hospitalization   Back Pain Associated symptoms: abdominal pain   Associated symptoms: no chest pain, no dysuria, no fever and no weakness     Past Medical History  Diagnosis Date  . Atopic dermatitis   . Interstitial cystitis   . Headache   . Pelvic floor dysfunction    Past Surgical History  Procedure Laterality Date  . No past surgeries    . Dental surgery     Family History  Problem Relation Age of Onset  . Bipolar disorder Mother   . Other Father     back problems  . Heart Problems Father   . Migraines Maternal Grandmother   . Migraines Paternal Grandmother    History  Substance Use Topics  . Smoking status: Never Smoker   . Smokeless tobacco: Never Used  . Alcohol Use: Yes     Comment: once a week   OB History    No data available     Review of Systems  Constitutional: Negative for fever, chills and fatigue.   HENT: Negative for trouble swallowing.   Eyes: Negative for visual disturbance.  Respiratory: Negative for shortness of breath.   Cardiovascular: Negative for chest pain and palpitations.  Gastrointestinal: Positive for abdominal pain. Negative for nausea, vomiting and diarrhea.  Genitourinary: Negative for dysuria and difficulty urinating.  Musculoskeletal: Positive for back pain. Negative for arthralgias and neck pain.  Skin: Negative for color change.  Neurological: Negative for dizziness and weakness.  Psychiatric/Behavioral: Negative for dysphoric mood.      Allergies  Amoxicillin; Penicillins; Sulfa antibiotics; and Latex  Home Medications   Prior to Admission medications   Medication Sig Start Date End Date Taking? Authorizing Provider  cetirizine (ZYRTEC) 10 MG tablet Take 10 mg by mouth daily.   Yes Historical Provider, MD  Cholecalciferol (VITAMIN D-3) 1000 UNITS CAPS Take 1 capsule by mouth daily.   Yes Historical Provider, MD  mometasone (ELOCON) 0.1 % cream Apply 1 application topically daily as needed.   Yes Historical Provider, MD  ondansetron (ZOFRAN-ODT) 4 MG disintegrating tablet Take 4 mg by mouth every 8 (eight) hours as needed for nausea or vomiting.  08/06/14  Yes Historical Provider, MD  vitamin E 100 UNIT capsule Take 100 Units by mouth daily.   Yes Historical Provider, MD  escitalopram (LEXAPRO) 5 MG tablet Take 5 mg by mouth daily.    Historical Provider, MD   BP 105/63 mmHg  Pulse 90  Temp(Src) 98.3 F (36.8 C) (Oral)  Resp 16  Ht 5\' 1"  (1.549 m)  Wt 108 lb (48.988 kg)  BMI 20.42 kg/m2  SpO2 100%  LMP 07/23/2014 Physical Exam  Constitutional: She is oriented to person, place, and time. She appears well-developed and well-nourished. No distress.  HENT:  Head: Normocephalic and atraumatic.  Eyes: Conjunctivae and EOM are normal.  Neck: Normal range of motion.  Cardiovascular: Normal rate and regular rhythm.  Exam reveals no gallop and no friction  rub.   No murmur heard. Pulmonary/Chest: Effort normal and breath sounds normal. She has no wheezes. She has no rales. She exhibits no tenderness.  Abdominal: Soft. She exhibits no distension. There is tenderness. There is no rebound and no guarding.  Epigastric tenderness to palpation. No other focal tenderness.   Musculoskeletal: Normal range of motion.  Neurological: She is alert and oriented to person, place, and time. Coordination normal.  Speech is goal-oriented. Moves limbs without ataxia.   Skin: Skin is warm and dry.  Psychiatric: She has a normal mood and affect. Her behavior is normal.  Nursing note and vitals reviewed.   ED Course  Procedures (including critical care time) Labs Review Labs Reviewed  COMPREHENSIVE METABOLIC PANEL - Abnormal; Notable for the following:    Potassium 3.4 (*)    All other components within normal limits  URINALYSIS, ROUTINE W REFLEX MICROSCOPIC (NOT AT [facility]) - Abnormal; Notable for the following:    Specific Gravity, Urine 1.004 (*)    All other components within normal limits  CBC WITH DIFFERENTIAL/PLATELET  LIPASE, BLOOD  POC URINE PREG, ED    Imaging Review [place] Abdomen Complete  08/15/2014   CLINICAL DATA:  Right upper quadrant abdominal pain. History of cystitis. Initial encounter.  EXAM: ULTRASOUND ABDOMEN COMPLETE  COMPARISON:  None.  FINDINGS: Gallbladder: No gallstones or wall thickening visualized. No sonographic Murphy sign noted.  Common bile duct: Diameter: 3.3 mm.  Liver: No focal lesion identified. Within normal limits in parenchymal echogenicity.  IVC: No abnormality visualized.  Pancreas: Visualized portion unremarkable.  Spleen: Size and appearance within normal limits.  Right Kidney: Length: 9.5 cm. Echogenicity within normal limits. No mass or hydronephrosis visualized.  Left Kidney: Length: 9.4 cm. Echogenicity within normal limits. No mass or hydronephrosis visualized.  Abdominal aorta: No aneurysm visualized.  Other findings:  None.  IMPRESSION: No acute or significant abdominal findings. No evidence of cholecystitis.   Electronically Signed   By: Carey BullocksWilliam  Veazey M.D.   On: 08/15/2014 20:04     EKG Interpretation None      MDM   Final diagnoses:  RUQ pain    6:30 PM Labs and urinalysis unremarkable for acute changes. Patient will have RUQ US to evaluate gallbladder.   8:31 PM Patient's US unremarkable for acute changes. Vitals stable and patient afebrile. Patient's pain improved. Patient will be discharged with protonix, vicodin, and zofran.     Emilia BeckKaitlyn Karris Deangelo, PA-C 08/15/14 2032  Eber HongBrian Miller, MD 08/16/14 [phone]

## 2014-08-15 NOTE — ED Notes (Signed)
Patient c/o abdominal pain/epigastssric and RLQ. Patient's last BM was today after taking laxatives. Patient states she has been having constipation. [facility] and was told to come to the ED for an ultrasound.

## 2014-11-13 DIAGNOSIS — Z21 Asymptomatic human immunodeficiency virus [HIV] infection status: Secondary | ICD-10-CM | POA: Insufficient documentation

## 2016-07-15 DIAGNOSIS — M41129 Adolescent idiopathic scoliosis, site unspecified: Secondary | ICD-10-CM | POA: Insufficient documentation

## 2016-07-15 DIAGNOSIS — J301 Allergic rhinitis due to pollen: Secondary | ICD-10-CM | POA: Insufficient documentation

## 2017-10-13 DIAGNOSIS — S83012A Lateral subluxation of left patella, initial encounter: Secondary | ICD-10-CM | POA: Insufficient documentation

## 2019-01-17 ENCOUNTER — Other Ambulatory Visit: Payer: Self-pay

## 2019-01-17 DIAGNOSIS — Z20822 Contact with and (suspected) exposure to covid-19: Secondary | ICD-10-CM

## 2019-01-18 LAB — NOVEL CORONAVIRUS, NAA: SARS-CoV-2, NAA: NOT DETECTED

## 2019-12-18 ENCOUNTER — Other Ambulatory Visit: Payer: Self-pay | Admitting: Neurology

## 2019-12-18 DIAGNOSIS — R200 Anesthesia of skin: Secondary | ICD-10-CM

## 2019-12-18 DIAGNOSIS — M542 Cervicalgia: Secondary | ICD-10-CM

## 2020-01-25 ENCOUNTER — Other Ambulatory Visit: Payer: Self-pay

## 2020-01-25 ENCOUNTER — Ambulatory Visit
Admission: RE | Admit: 2020-01-25 | Discharge: 2020-01-25 | Disposition: A | Discharge status: Home / Self Care | Payer: 59 | Source: Ambulatory Visit | Attending: Neurology | Admitting: Neurology

## 2020-01-25 DIAGNOSIS — M542 Cervicalgia: Secondary | ICD-10-CM | POA: Insufficient documentation

## 2020-01-25 DIAGNOSIS — R200 Anesthesia of skin: Secondary | ICD-10-CM | POA: Insufficient documentation

## 2020-01-25 IMAGING — MR MR CERVICAL SPINE WO/W CM
5 of 8 series · 29 of 48 positions shown · IV contrast (gadavist)
Comparison: Report from radiographs of the cervical spine
[DATE] (images unavailable).

CLINICAL DATA: Neck pain. Additional history provided: Recurring
right side base of skull pain radiating into right side of face and
into neck as well as right arm for many years, progressive,
sometimes associated with facial numbness/tingling in right arm
numbness.

EXAM:
MRI CERVICAL SPINE WITHOUT AND WITH CONTRAST
TECHNIQUE: Multiplanar and multiecho pulse sequences of the cervical spine, to
include the craniocervical junction and cervicothoracic junction,
were obtained without and with intravenous contrast.
CONTRAST:  5mL GADAVIST GADOBUTROL 1 MMOL/ML IV SOLN

[Series 9: T2 · sagittal · 3.0mm · 0.62mm/px · 4 of 15 slices shown (1 of 2)]
[im 1/15]
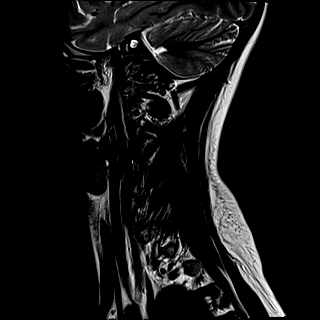
[im 5/15]
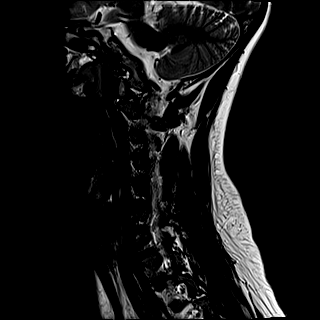
[im 10/15]
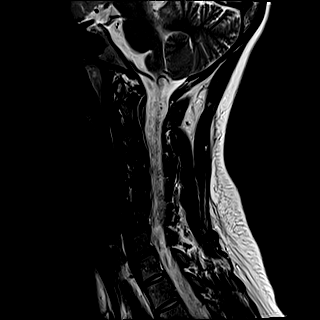
[im 15/15]
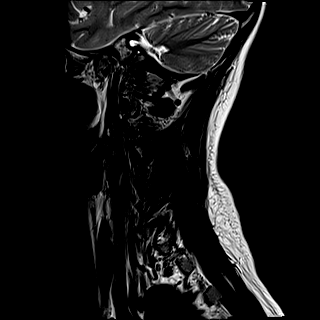

[Series 11: STIR · sagittal · 3.0mm · 0.62mm/px · 4 of 15 slices shown]
[im 1/15]
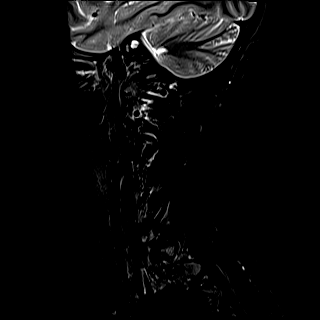
[im 5/15]
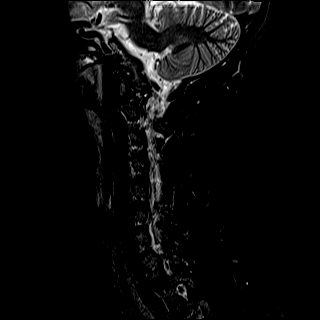
[im 10/15]
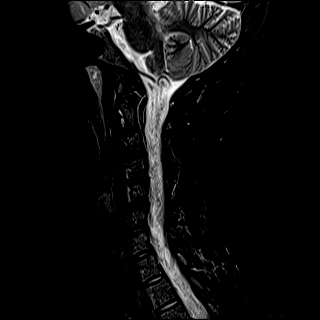
[im 15/15]
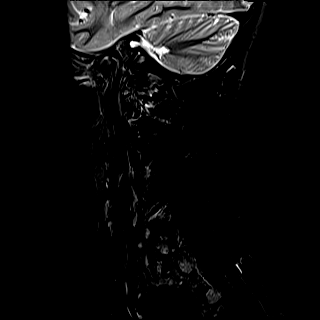

[Series 12: T2 · axial · 3.0mm · 0.70mm/px · z∈[-174,-76]mm · 8 of 29 slices shown (2 of 2)]
[im 1/29]
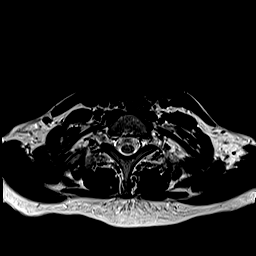
[im 5/29]
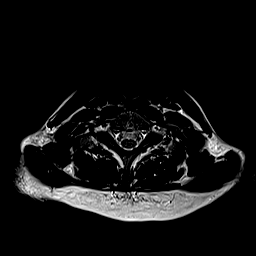
[im 9/29]
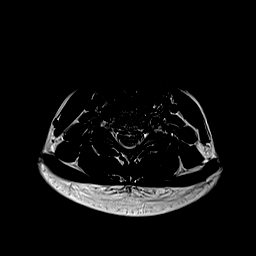
[im 13/29]
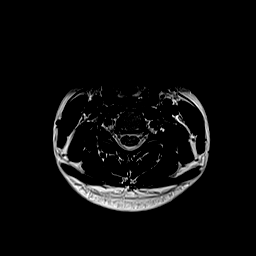
[im 17/29]
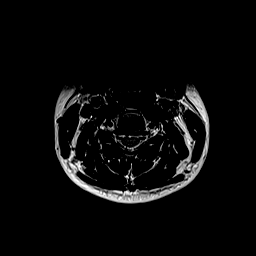
[im 21/29]
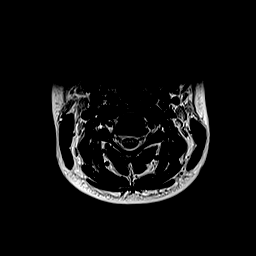
[im 25/29]
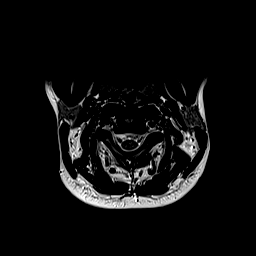
[im 29/29]
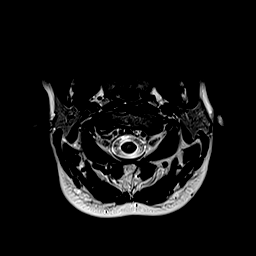

[Series 14: T1 · axial · non-contrast · 3.0mm · 0.35mm/px · z∈[-174,-76]mm · 8 of 29 slices shown]
[im 1/29]
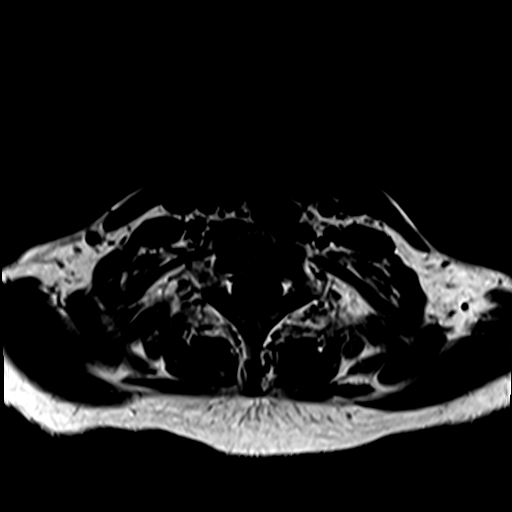
[im 5/29]
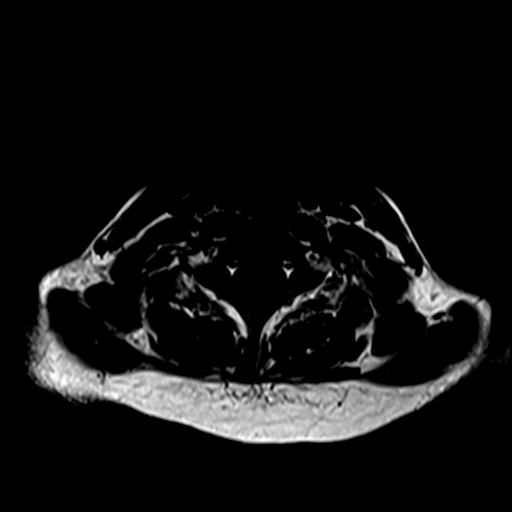
[im 9/29]
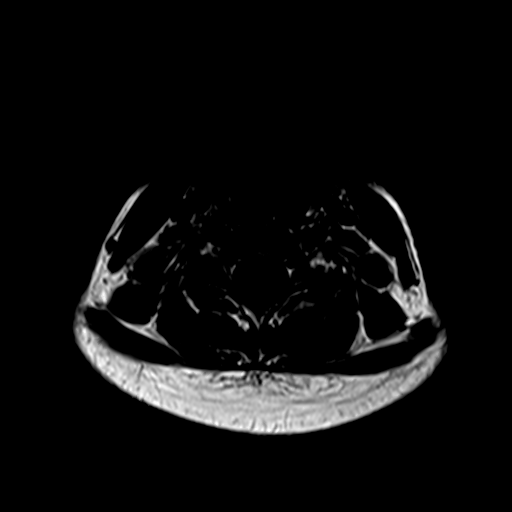
[im 13/29]
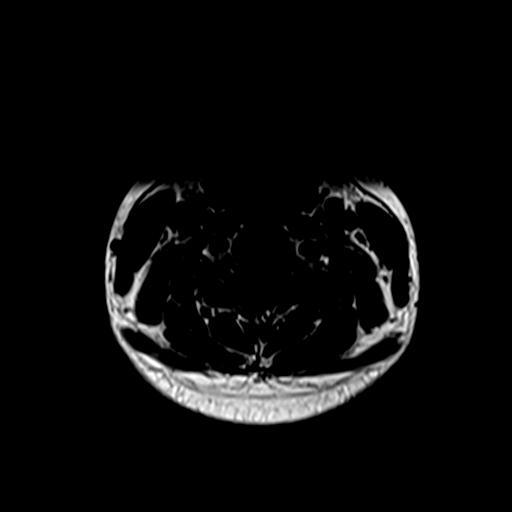
[im 17/29]
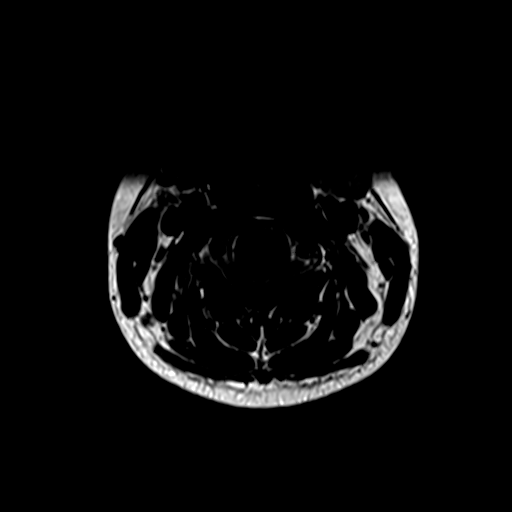
[im 21/29]
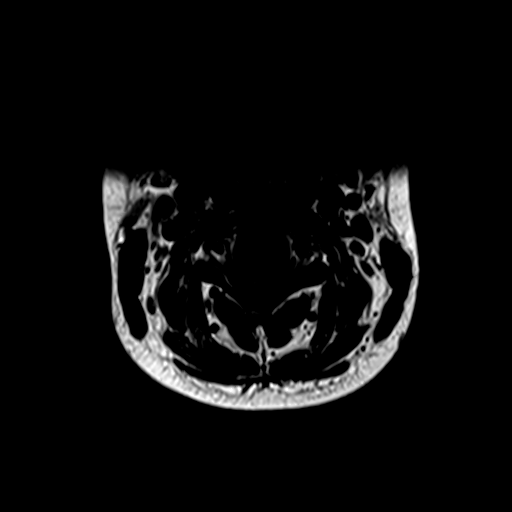
[im 25/29]
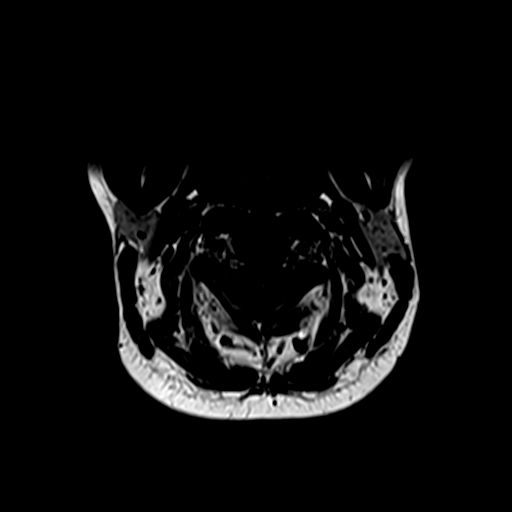
[im 29/29]
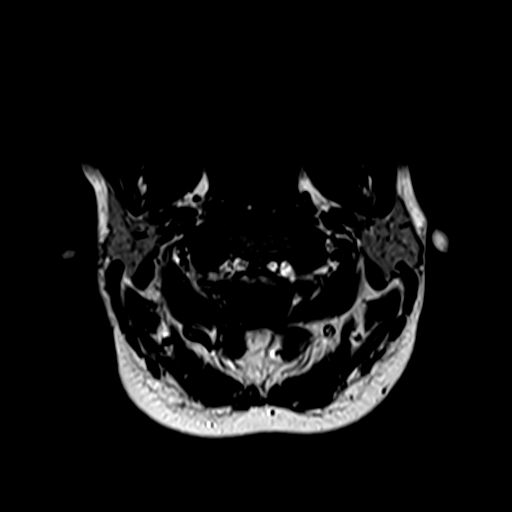

[Series 16: T1 post-contrast · axial · 3.0mm · 0.35mm/px · z∈[-174,-118]mm · 5 of 29 slices shown]
[im 1/29]
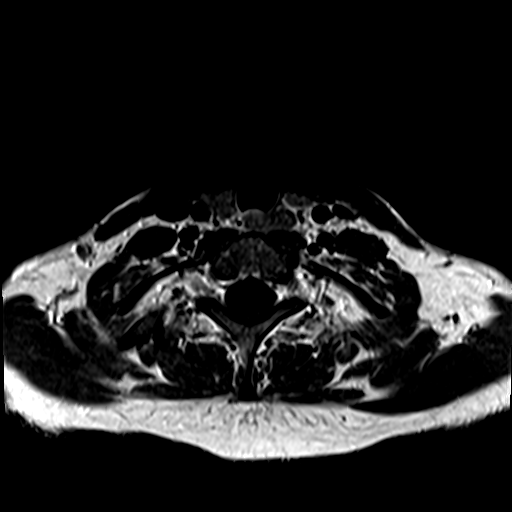
[im 5/29]
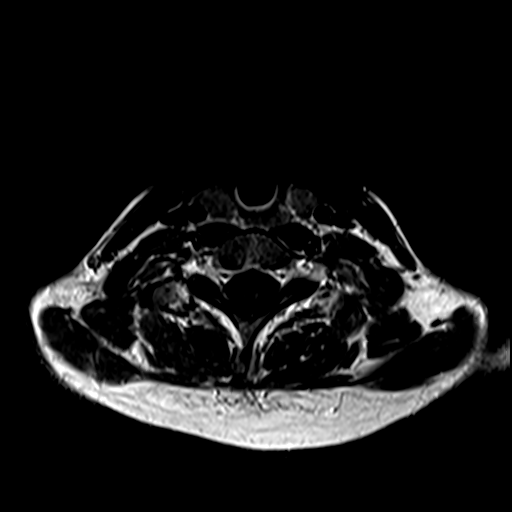
[im 9/29]
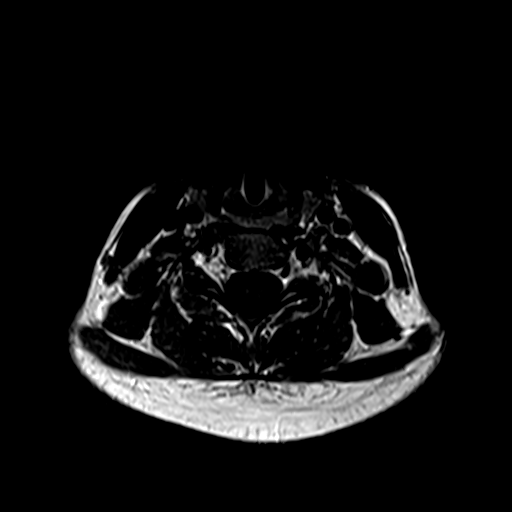
[im 13/29]
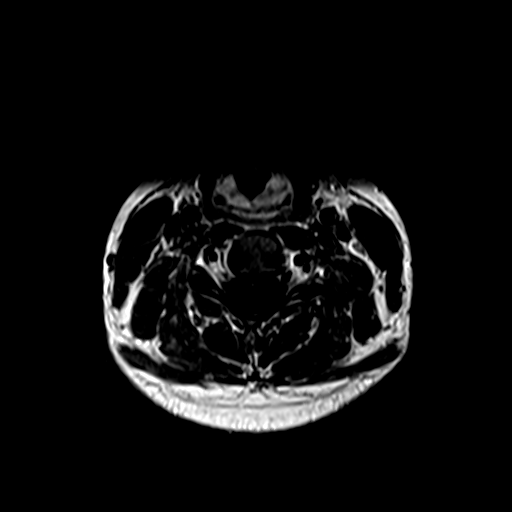
[im 17/29]
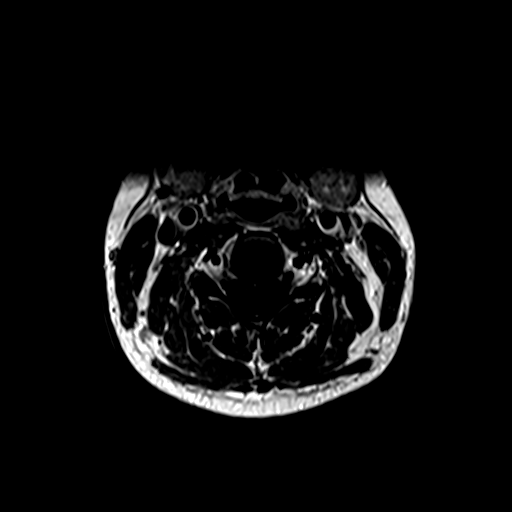

[29 of 48 positions shown; findings below may reference images not displayed]

FINDINGS: Alignment: Reversal of the expected cervical lordosis. Trace C2-C3
grade 1 anterolisthesis.

Vertebrae: Vertebral body height is maintained. No marrow edema or
focal suspicious osseous lesion.

Cord: No spinal cord signal abnormality or abnormal spinal cord
enhancement.

Posterior Fossa, vertebral arteries, paraspinal tissues: Posterior
fossa better assessed on concurrently performed brain MRI. Flow
voids preserved within the imaged cervical vertebral arteries.
Paraspinal soft tissues within normal limits. Nonspecific prominence
of the adenoid soft tissues.

Disc levels:

Minimal disc desiccation at C3-C4, C4-C5 and C5-C6. No significant
disc herniation, spinal canal stenosis or neural foraminal narrowing
at any level.
IMPRESSION: Reversal of the expected cervical lordosis. Trace C2-C3 grade 1
anterolisthesis.

Minimal disc desiccation at C3-C4, C4-C5 and C5-C6.

No significant disc herniation, spinal canal stenosis or neural
foraminal narrowing at any level.

No spinal cord signal abnormality or abnormal spinal cord
enhancement.

Mild nonspecific prominence of the adenoid soft tissues.
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
## 2020-01-25 MED ORDER — GADOBUTROL 1 MMOL/ML IV SOLN
5.0000 mL | Freq: Once | INTRAVENOUS | Status: AC | PRN
  Administered 2020-01-25: 5 mL via INTRAVENOUS

## 2020-02-08 ENCOUNTER — Ambulatory Visit: Payer: BLUE CROSS/BLUE SHIELD

## 2020-02-08 ENCOUNTER — Other Ambulatory Visit: Payer: BLUE CROSS/BLUE SHIELD

## 2020-04-01 DIAGNOSIS — G43411 Hemiplegic migraine, intractable, with status migrainosus: Secondary | ICD-10-CM | POA: Insufficient documentation

## 2020-04-01 DIAGNOSIS — M5481 Occipital neuralgia: Secondary | ICD-10-CM | POA: Insufficient documentation

## 2020-04-25 DIAGNOSIS — G5603 Carpal tunnel syndrome, bilateral upper limbs: Secondary | ICD-10-CM | POA: Insufficient documentation

## 2020-04-25 DIAGNOSIS — Q796 Ehlers-Danlos syndrome, unspecified: Secondary | ICD-10-CM | POA: Insufficient documentation

## 2020-05-28 DIAGNOSIS — M797 Fibromyalgia: Secondary | ICD-10-CM | POA: Insufficient documentation

## 2020-09-19 DIAGNOSIS — R87619 Unspecified abnormal cytological findings in specimens from cervix uteri: Secondary | ICD-10-CM | POA: Insufficient documentation

## 2020-09-19 DIAGNOSIS — F419 Anxiety disorder, unspecified: Secondary | ICD-10-CM | POA: Insufficient documentation

## 2020-12-29 DIAGNOSIS — M249 Joint derangement, unspecified: Secondary | ICD-10-CM | POA: Insufficient documentation

## 2021-04-17 ENCOUNTER — Other Ambulatory Visit: Payer: Self-pay | Admitting: Student

## 2021-04-17 DIAGNOSIS — Z21 Asymptomatic human immunodeficiency virus [HIV] infection status: Secondary | ICD-10-CM

## 2021-04-17 DIAGNOSIS — Q796 Ehlers-Danlos syndrome, unspecified: Secondary | ICD-10-CM

## 2021-05-01 ENCOUNTER — Other Ambulatory Visit: Payer: 59

## 2021-05-02 ENCOUNTER — Other Ambulatory Visit: Payer: 59

## 2021-05-19 ENCOUNTER — Ambulatory Visit
Admission: RE | Admit: 2021-05-19 | Discharge: 2021-05-19 | Disposition: A | Discharge status: Home / Self Care | Payer: BC Managed Care – PPO | Source: Ambulatory Visit | Attending: Student | Admitting: Student

## 2021-05-19 DIAGNOSIS — Q796 Ehlers-Danlos syndrome, unspecified: Secondary | ICD-10-CM

## 2021-05-19 DIAGNOSIS — Z21 Asymptomatic human immunodeficiency virus [HIV] infection status: Secondary | ICD-10-CM

## 2021-05-19 IMAGING — MR MR LUMBAR SPINE WO/W CM
4 of 7 series · 18 of 48 positions shown · IV contrast (multihance)
Comparison: Report from radiographs of the thoracic spine
[DATE]. Report from lumbar spine radiographs [DATE].

CLINICAL DATA: Ehlers-Danlos syndrome. Asymptomatic human
immunodeficiency virus (HIV) infection status. Additional history
provided by scanning technologist: Patient reports intermittent pain
in thoracic and lumbar spine, occasional right arm numbness,
Ehlers-Danlos syndrome.

EXAM:
MRI THORACIC AND LUMBAR SPINE WITHOUT AND WITH CONTRAST
TECHNIQUE: Multiplanar and multiecho pulse sequences of the thoracic and lumbar
spine were obtained without intravenous contrast.
CONTRAST:  15 mL MultiHance intravenous contrast.

[Series 6: T2 · sagittal · 4.0mm · 0.73mm/px · 3 of 13 slices shown (1 of 2)]
[im 1/13]
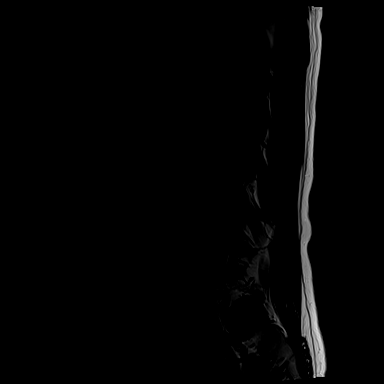
[im 7/13]
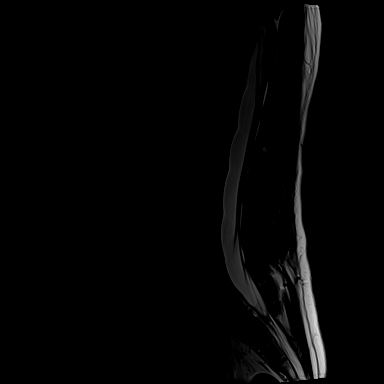
[im 13/13]
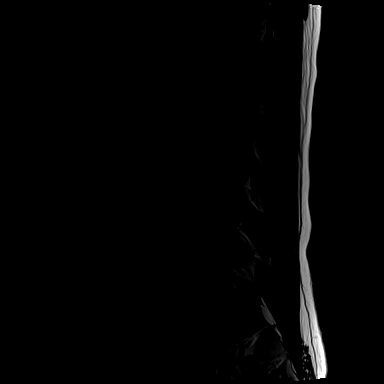

[Series 7: T1 · sagittal · 4.0mm · 0.73mm/px · 3 of 13 slices shown (1 of 2)]
[im 1/13]
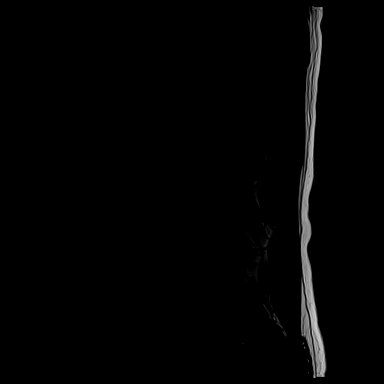
[im 9/13]
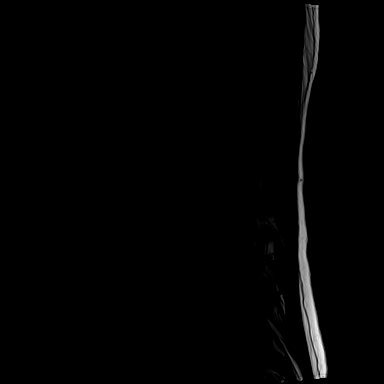
[im 13/13]
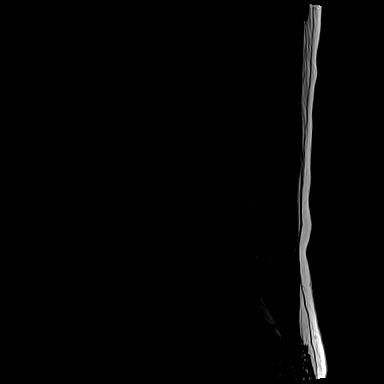

[Series 11: T1 · axial · 4.0mm · 0.28mm/px · z∈[-69,+115]mm · 3 of 38 slices shown (2 of 2)]
[im 4/38]
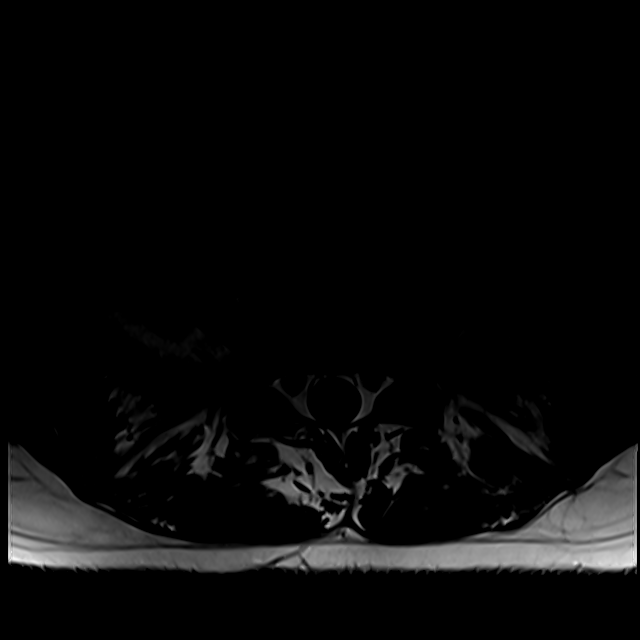
[im 19/38]
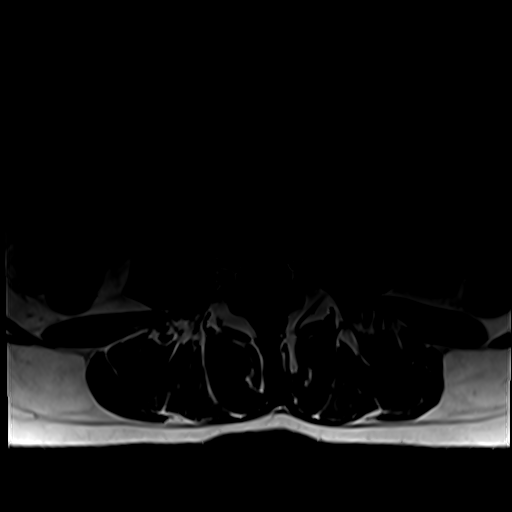
[im 34/38]
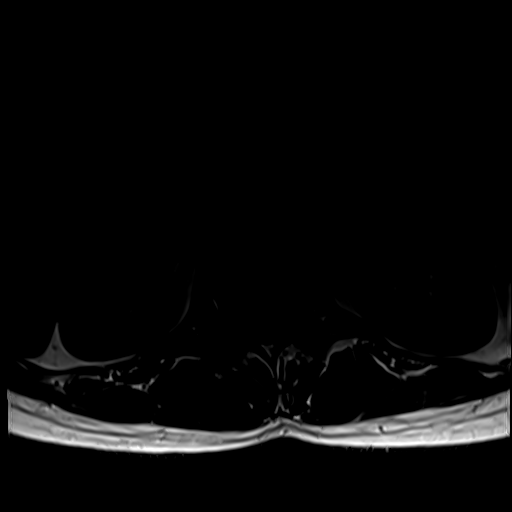

[Series 14: T2 · axial · 4.0mm · 0.28mm/px · z∈[-83,+115]mm · 9 of 38 slices shown (2 of 2)]
[im 1/38]
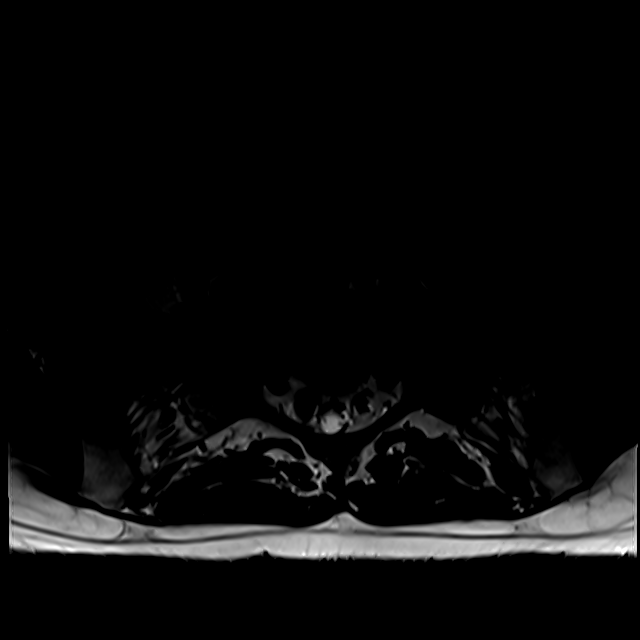
[im 4/38]
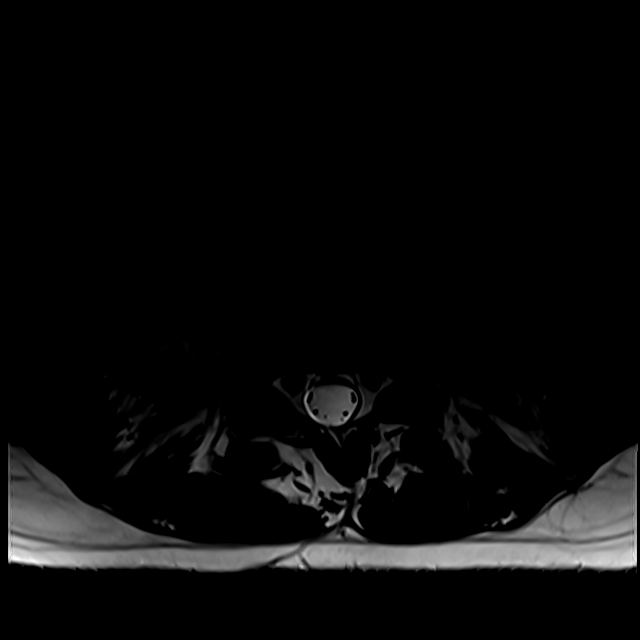
[im 8/38]
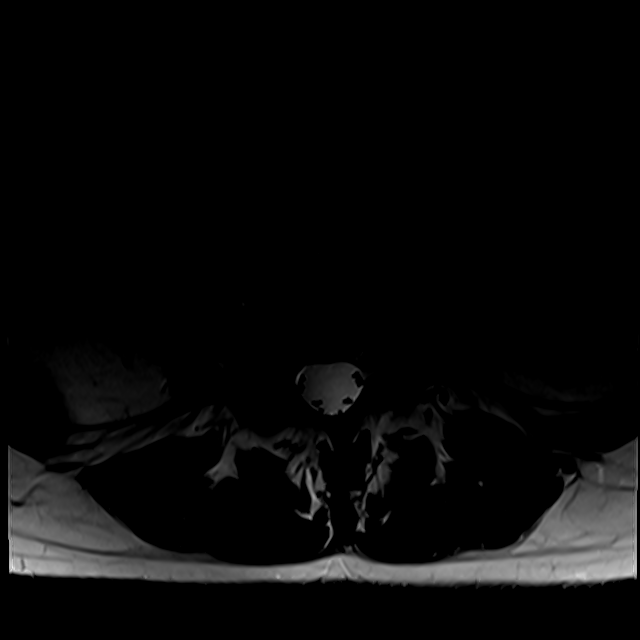
[im 12/38]
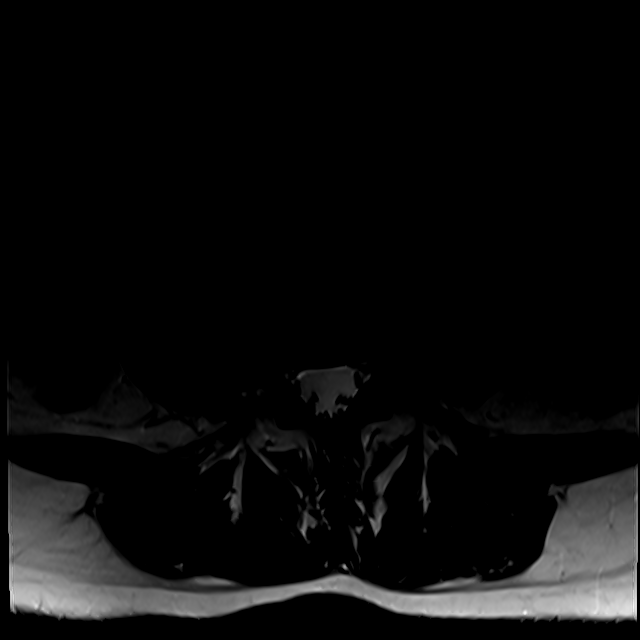
[im 15/38]
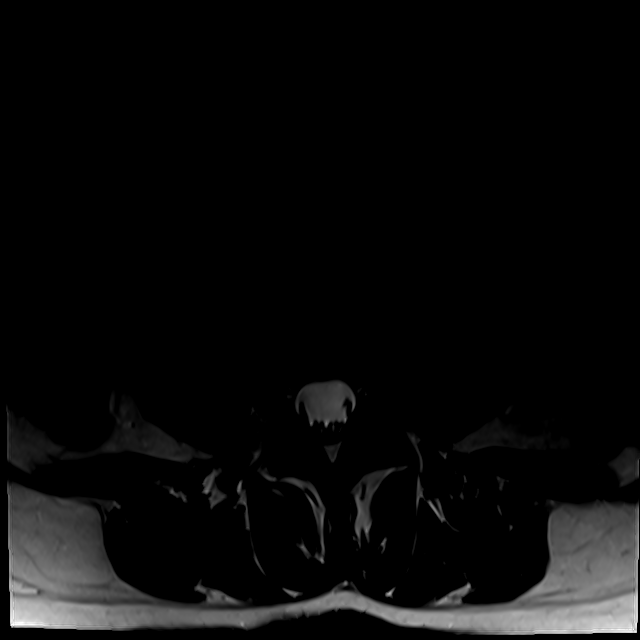
[im 19/38]
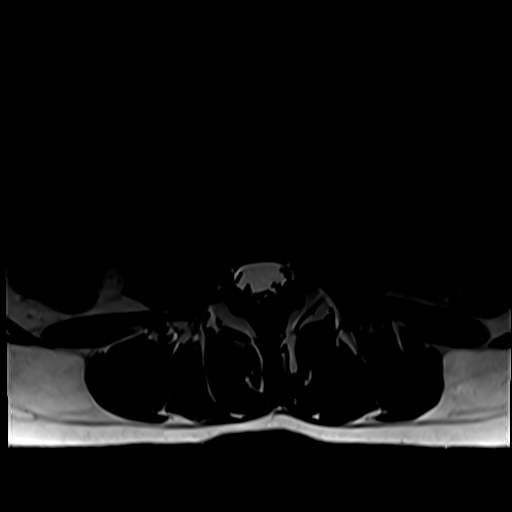
[im 23/38]
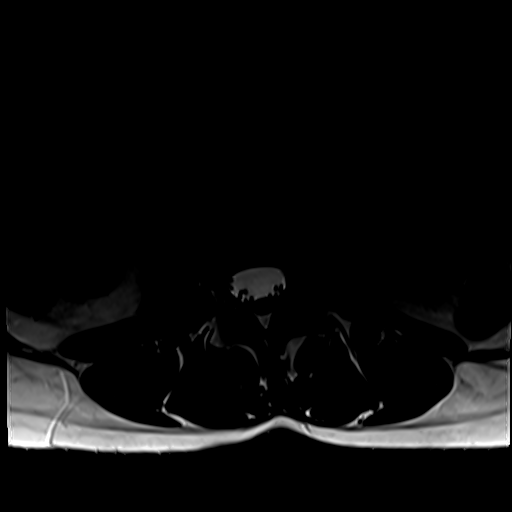
[im 26/38]
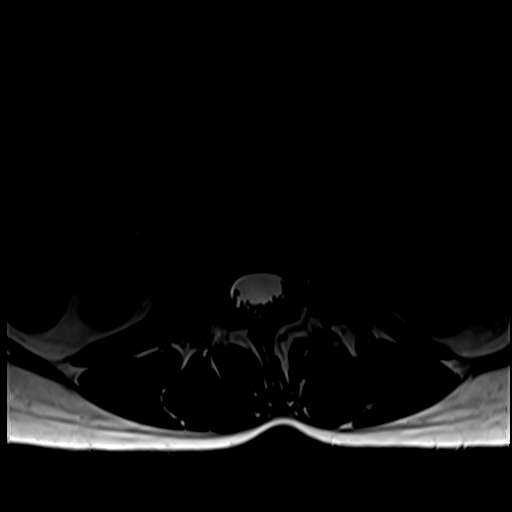
[im 34/38]
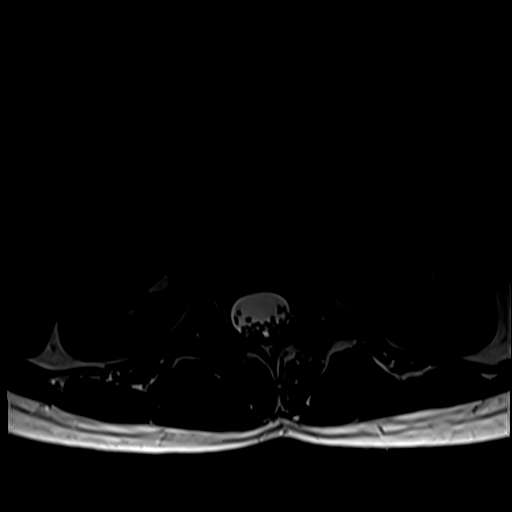

[18 of 48 positions shown; findings below may reference images not displayed]

FINDINGS: MRI THORACIC SPINE FINDINGS

Mild intermittent motion degradation.

Alignment: Thoracolumbar dextrocurvature. No significant
spondylolisthesis.

Vertebrae: Vertebral body height is maintained. No significant
marrow edema or focal suspicious osseous lesion.

Cord: No signal abnormality within the spinal cord at the thoracic
levels. No abnormal enhancement of the spinal cord at the thoracic
levels.

Paraspinal and other soft tissues: No abnormality identified within
included portions of the thorax or upper abdomen/retroperitoneum.
Paraspinal soft tissues unremarkable.

Disc levels:

Intervertebral disc height and hydration are preserved throughout
the thoracic spine. No significant disc herniation, spinal canal
stenosis or neural foraminal narrowing.

MRI LUMBAR SPINE FINDINGS

Segmentation: 5 lumbar vertebrae. The caudal most well-formed
intervertebral disc space is designated L5-S1.

Alignment: Thoracolumbar dextrocurvature. No significant
spondylolisthesis.

Vertebrae: Vertebral body height is maintained. No significant
marrow edema or focal suspicious osseous lesion.

Conus medullaris and cauda equina: Conus extends to the L1 level. No
signal abnormality within the visualized distal spinal cord. No
abnormal enhancement of the visualized distal spinal cord or cauda
equina nerve roots.

Paraspinal and other soft tissues: No abnormality identified within
included portions of the abdomen/retroperitoneum. Paraspinal soft
tissues unremarkable.

Disc levels:

Intervertebral disc height and hydration are preserved throughout
the lumbar spine. No significant disc herniation, spinal canal
stenosis or neural foraminal narrowing.
IMPRESSION: Thoracic spine:

1. Thoracolumbar dextrocurvature.
2. Otherwise unremarkable exam. No significant disc herniation,
spinal canal stenosis or neural foraminal narrowing within the
thoracic spine.

Lumbar spine:

1. Thoracolumbar dextrocurvature.
2. Otherwise unremarkable examination. No significant disc
herniation, spinal canal stenosis or neural foraminal narrowing.

## 2021-05-19 IMAGING — MR MR THORACIC SPINE WO/W CM
9 series · 41 of 48 positions shown · IV contrast (multihance)
Comparison: Report from radiographs of the thoracic spine
[DATE]. Report from lumbar spine radiographs [DATE].

CLINICAL DATA: Ehlers-Danlos syndrome. Asymptomatic human
immunodeficiency virus (HIV) infection status. Additional history
provided by scanning technologist: Patient reports intermittent pain
in thoracic and lumbar spine, occasional right arm numbness,
Ehlers-Danlos syndrome.

EXAM:
MRI THORACIC AND LUMBAR SPINE WITHOUT AND WITH CONTRAST
TECHNIQUE: Multiplanar and multiecho pulse sequences of the thoracic and lumbar
spine were obtained without intravenous contrast.
CONTRAST:  15 mL MultiHance intravenous contrast.

[Series 3: T1 · sagittal · 3.0mm · 1.50mm/px · 4 of 13 slices shown (1 of 3)]
[im 1/13]
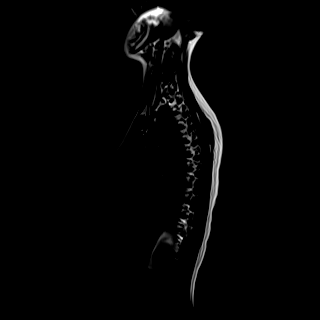
[im 5/13]
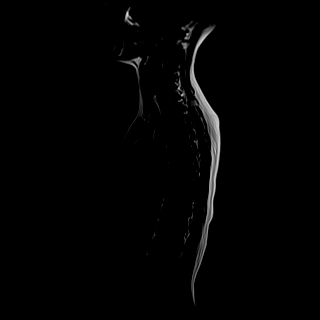
[im 9/13]
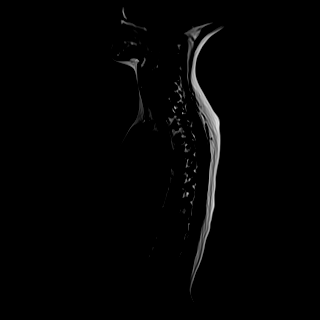
[im 13/13]
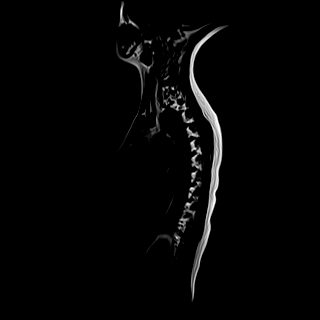

[Series 4: T1 · sagittal · 3.0mm · 0.94mm/px · 3 of 15 slices shown (2 of 3)]
[im 1/15]
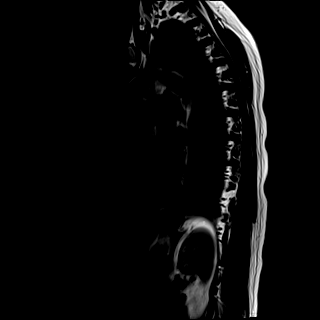
[im 8/15]
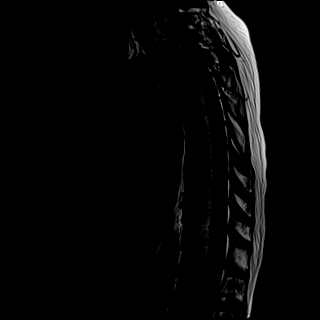
[im 15/15]
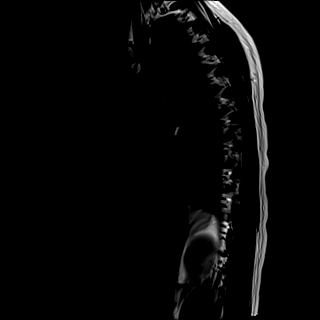

[Series 5: STIR · sagittal · 3.0mm · 1.06mm/px · 3 of 15 slices shown]
[im 1/15]
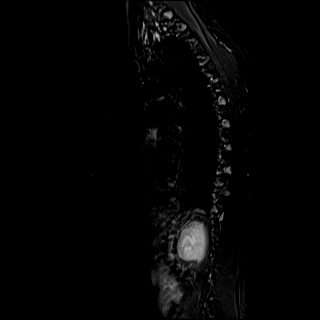
[im 8/15]
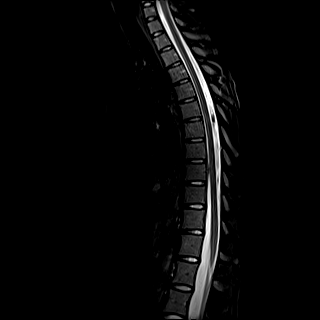
[im 15/15]
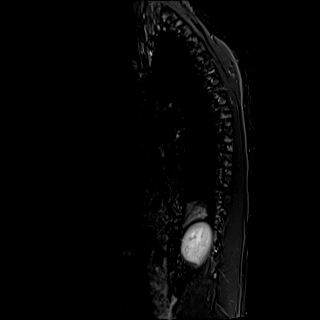

[Series 6: T2 · axial · 4.0mm · 0.62mm/px · z∈[+111,+341]mm · 8 of 39 slices shown]
[im 1/39]
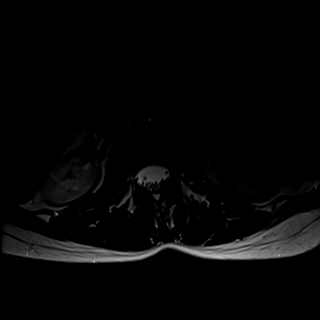
[im 6/39]
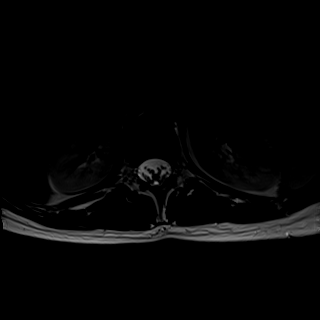
[im 11/39]
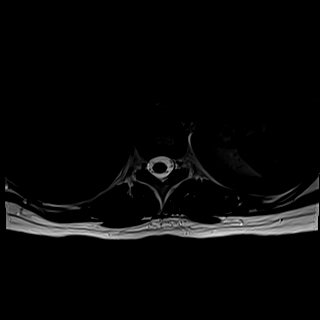
[im 17/39]
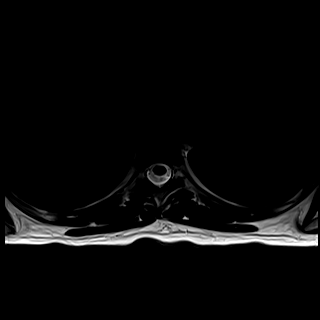
[im 22/39]
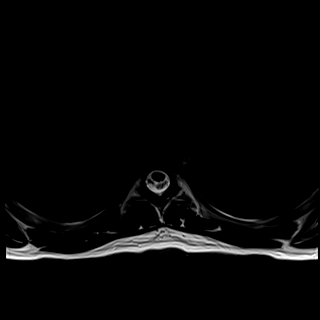
[im 28/39]
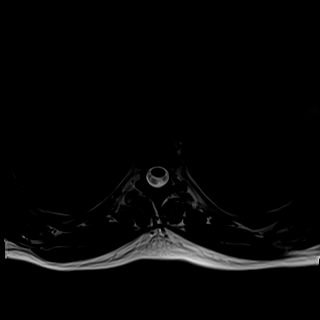
[im 33/39]
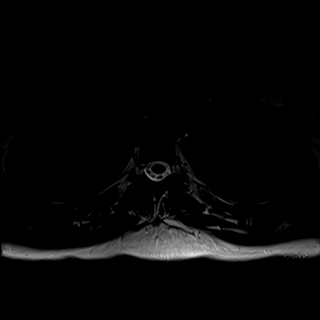
[im 39/39]
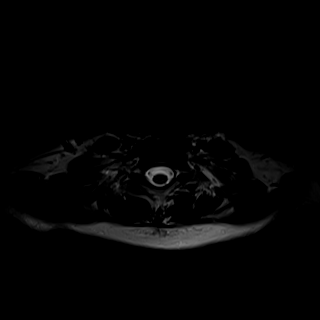

[Series 7: ax mpgr · axial · 4.0mm · 0.39mm/px · 1 of 39 slices shown]
[im 1/39]
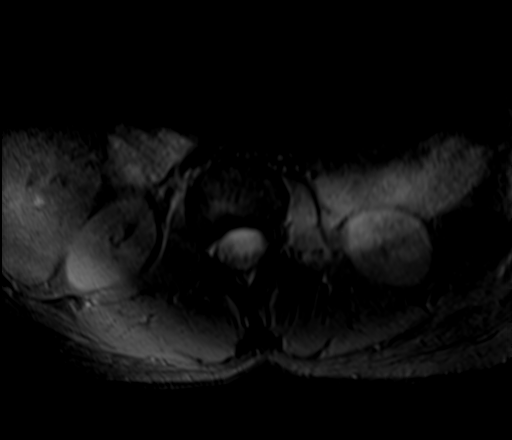

[Series 8: T1 · axial · 4.0mm · 0.62mm/px · z∈[+111,+341]mm · 8 of 39 slices shown (3 of 3)]
[im 1/39]
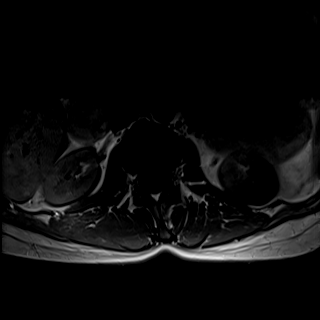
[im 6/39]
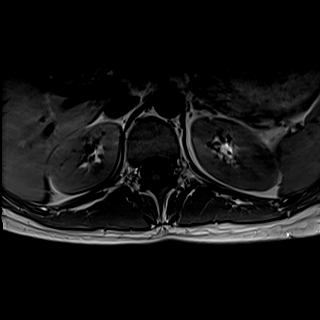
[im 11/39]
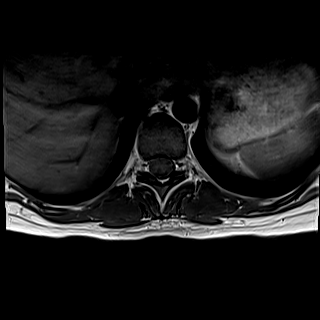
[im 17/39]
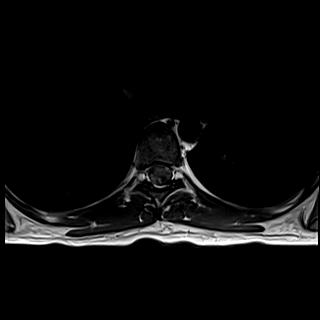
[im 22/39]
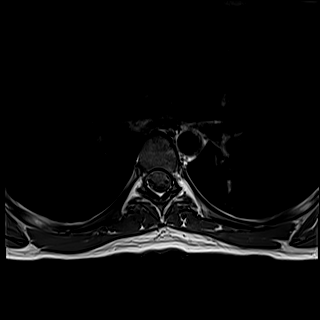
[im 28/39]
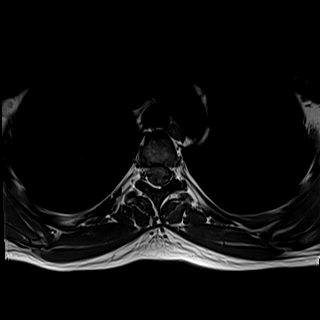
[im 33/39]
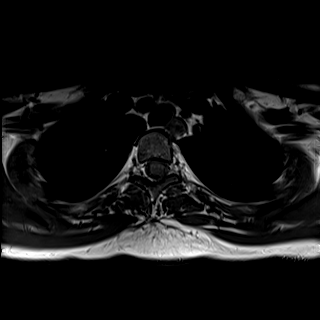
[im 39/39]
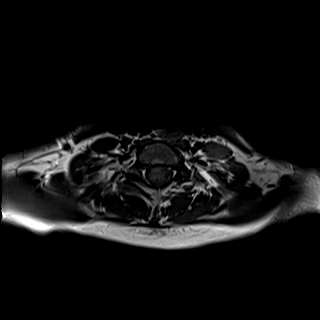

[Series 9: T2 post-contrast · sagittal · 3.0mm · 1.06mm/px · 3 of 15 slices shown]
[im 1/15]
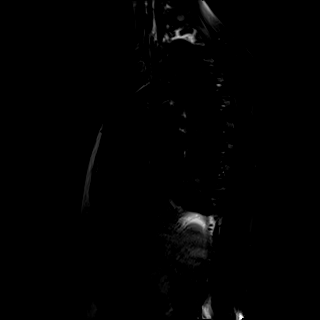
[im 8/15]
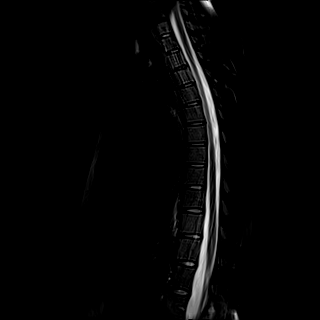
[im 15/15]
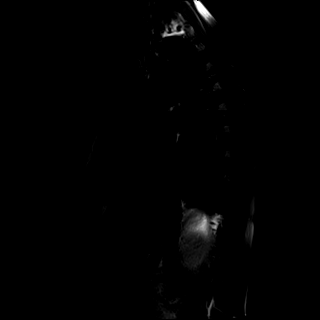

[Series 10: T1 fat-sat post-contrast · sagittal · 3.0mm · 1.00mm/px · 3 of 15 slices shown]
[im 1/15]
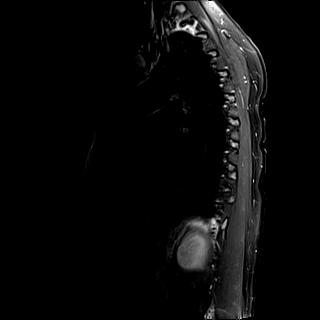
[im 8/15]
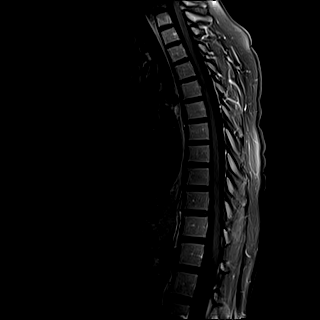
[im 15/15]
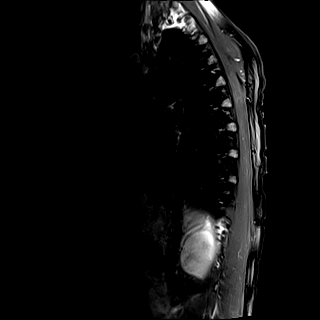

[Series 11: T1 post-contrast · axial · 4.0mm · 0.62mm/px · z∈[+111,+341]mm · 8 of 39 slices shown]
[im 1/39]
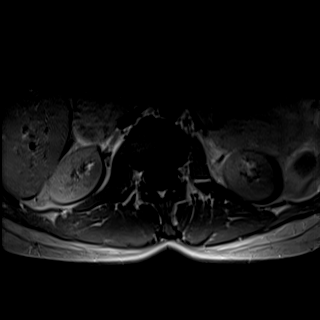
[im 6/39]
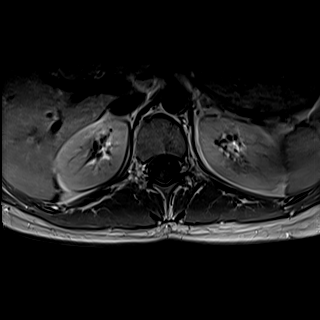
[im 11/39]
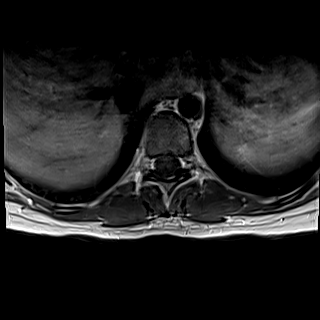
[im 17/39]
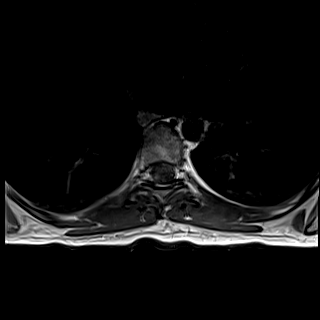
[im 22/39]
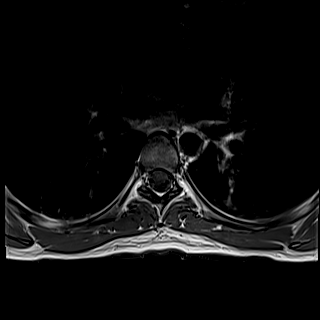
[im 28/39]
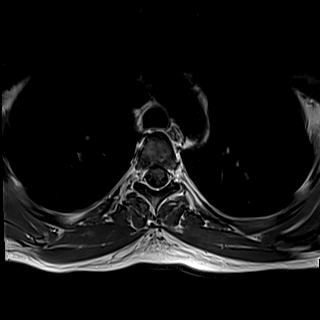
[im 33/39]
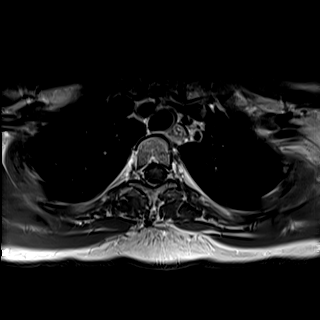
[im 39/39]
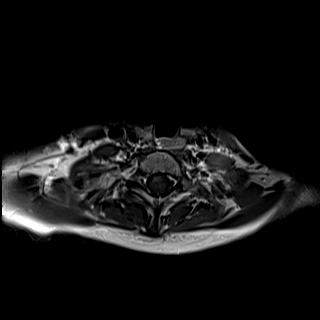

[41 of 48 positions shown; findings below may reference images not displayed]

FINDINGS: MRI THORACIC SPINE FINDINGS

Mild intermittent motion degradation.

Alignment: Thoracolumbar dextrocurvature. No significant
spondylolisthesis.

Vertebrae: Vertebral body height is maintained. No significant
marrow edema or focal suspicious osseous lesion.

Cord: No signal abnormality within the spinal cord at the thoracic
levels. No abnormal enhancement of the spinal cord at the thoracic
levels.

Paraspinal and other soft tissues: No abnormality identified within
included portions of the thorax or upper abdomen/retroperitoneum.
Paraspinal soft tissues unremarkable.

Disc levels:

Intervertebral disc height and hydration are preserved throughout
the thoracic spine. No significant disc herniation, spinal canal
stenosis or neural foraminal narrowing.

MRI LUMBAR SPINE FINDINGS

Segmentation: 5 lumbar vertebrae. The caudal most well-formed
intervertebral disc space is designated L5-S1.

Alignment: Thoracolumbar dextrocurvature. No significant
spondylolisthesis.

Vertebrae: Vertebral body height is maintained. No significant
marrow edema or focal suspicious osseous lesion.

Conus medullaris and cauda equina: Conus extends to the L1 level. No
signal abnormality within the visualized distal spinal cord. No
abnormal enhancement of the visualized distal spinal cord or cauda
equina nerve roots.

Paraspinal and other soft tissues: No abnormality identified within
included portions of the abdomen/retroperitoneum. Paraspinal soft
tissues unremarkable.

Disc levels:

Intervertebral disc height and hydration are preserved throughout
the lumbar spine. No significant disc herniation, spinal canal
stenosis or neural foraminal narrowing.
IMPRESSION: Thoracic spine:

1. Thoracolumbar dextrocurvature.
2. Otherwise unremarkable exam. No significant disc herniation,
spinal canal stenosis or neural foraminal narrowing within the
thoracic spine.

Lumbar spine:

1. Thoracolumbar dextrocurvature.
2. Otherwise unremarkable examination. No significant disc
herniation, spinal canal stenosis or neural foraminal narrowing.

## 2021-05-19 MED ORDER — GADOBENATE DIMEGLUMINE 529 MG/ML IV SOLN
15.0000 mL | Freq: Once | INTRAVENOUS | Status: AC | PRN
  Administered 2021-05-19: 15 mL via INTRAVENOUS

## 2021-08-25 ENCOUNTER — Other Ambulatory Visit: Payer: Self-pay | Admitting: Nurse Practitioner

## 2021-08-25 DIAGNOSIS — N644 Mastodynia: Secondary | ICD-10-CM

## 2021-09-11 ENCOUNTER — Ambulatory Visit
Admission: RE | Admit: 2021-09-11 | Discharge: 2021-09-11 | Disposition: A | Discharge status: Home / Self Care | Payer: BC Managed Care – PPO | Source: Ambulatory Visit | Attending: Nurse Practitioner | Admitting: Nurse Practitioner

## 2021-09-11 DIAGNOSIS — N6320 Unspecified lump in the left breast, unspecified quadrant: Secondary | ICD-10-CM

## 2021-12-15 DIAGNOSIS — M7521 Bicipital tendinitis, right shoulder: Secondary | ICD-10-CM | POA: Insufficient documentation

## 2022-10-26 ENCOUNTER — Encounter: Payer: Self-pay | Admitting: Neurology

## 2022-12-24 ENCOUNTER — Ambulatory Visit: Payer: BC Managed Care – PPO | Admitting: Neurology

## 2022-12-24 VITALS — BP 135/84 | HR 84 | Ht 63.0 in | Wt 131.2 lb

## 2022-12-24 DIAGNOSIS — G43009 Migraine without aura, not intractable, without status migrainosus: Secondary | ICD-10-CM | POA: Diagnosis not present

## 2022-12-24 NOTE — Progress Notes (Unsigned)
NEUROLOGY CONSULTATION NOTE  Kayla Gamble MRN: [id number] DOB: September 04, 1995  Referring provider: Cristopher Peru, MD Primary care provider: Aram Beecham, MD  Reason for consult:  migraines  Assessment/Plan:   Migraine without aura, without status migrainosus, not intractable Cervicalgia   Migraine prevention:  Ajovy every 4 weeks Migraine rescue:  Ubrelvy 100mg  Limit use of pain relievers to no more than 2 days out of week to prevent risk of rebound or medication-overuse headache. Keep headache diary Follow up 6 months.    Subjective:  Kayla Gamble is a 26 year old right-handed female with Ehlers Danlos syndrome, HIV and anxiety who presents for transfer of care regarding her migraines.  History supplemented by her previous neurologist's/referring provider's notes.  She has had migraines that started around puberty.  It is a 7/10 stabbing pressure type headache across the back of the head or either (mostly right) retro-orbital pain.  Associated with neck stiffness (usually right sided), photophobia, phonophobia, mild nausea.  She feels burning pain in her right trapezius that radiates a numbness and tingling down the right arm if she touches that spot in the right upper back.  Lasts hours to 2 days.  Occurs 4 times a week.  Since starting Ajovy, she may have a couple of mild headaches but will have 2 or 3 migraines they lasting 6-12 hours the last week before next injection.  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  Relieving factors include Excedrin or applying heat to the back of her neck.    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  Otherwise, unremarkable MRI appearance of the brain with and without contrast.  No evidence of acute intracranial abnormality.  Minimal ethmoid  sinus mucosal thickening.  MRI of C-spine on 01/25/2020 showed "reversal of the expected cervical lordosis.  Trace C2-C3 grade 1 anterolisthesis.  Minimal disc desiccation at C3-C4, C4-C5 and C5-C6. No significant disc herniation, spinal canal stenosis or neural foraminal narrowing at any level. No spinal cord signal abnormality or abnormal spinal cord enhancement. Mild nonspecific prominence of the adenoid soft tissues."   She also has low back pain and right sided sciatica.  If she had been physically active, her legs may start to shake.  MRI of T-spine and L-spine with and without contrast on 05/19/2021 showed "1. Thoracolumbar dextrocurvature. 2. Otherwise unremarkable exam. No significant disc herniation, spinal canal stenosis or neural foraminal narrowing within the thoracic spine.  "She also has a tremor in left hand.  Father had it since a stroke but otherwise, no family history.     Past NSAIDS/analgesics:  ibuprofen, Norco, Past abortive triptans:  rizatriptan, sumatriptan,  Past abortive ergotamine:  none Past muscle relaxants:  none Past anti-emetic:  Zofran ODT 4mg  Past antihypertensive medications:  propranolol (orthostatic hypotension) contraindicated (history of orthostatic hypotension) Past antidepressant medications:  nortriptyliine, sertraline, escitalopram Past anticonvulsant medications:  gabapentin, pregablin Past anti-CGRP:  Emgality, Aimovig (wears off end of month), Qulipta 60mg  (fatigue) Past vitamins/Herbal/Supplements:  none Past antihistamines/decongestants:  none Other past therapies:  prednisone taper, occipital nerve block (effective)   Current NSAIDS/analgesics:  Excedrin Migraine Current triptans:  none Current ergotamine:  none Current anti-emetic:  none Current muscle relaxants:  none Current Antihypertensive medications:  none Current Antidepressant medications:  sertraline  Current Anticonvulsant medications:  lamotrigine 100mg  daily  (anxiety) Current  anti-CGRP:  Ajovy Current Vitamins/Herbal/Supplements:  none Current Antihistamines/Decongestants:  Xyzal Other therapy:  heat to back of head/neck Birth control:  Junel Fe Other medications:  Biktarvy   Caffeine:  1 cup of coffee in morning.  Occasional cola Diet:  Mostly drinks water.  Dark chocolate.  Sometimes skips meals but makes sure she snacks.  Breakfast, dinner and may have sandwich for lunch. Exercise: barre classes Depression:  stable; Anxiety:  yes.   CNIS Other pain:  joint pain, fibromyalgia Sleep hygiene:  usually good unless she is woken up by her cat Family history of headache:  paternal grandmother (migraines), maternal grandmother (tension-type)      PAST MEDICAL HISTORY: Past Medical History:  Diagnosis Date   Atopic dermatitis    Headache    Interstitial cystitis    Pelvic floor dysfunction     PAST SURGICAL HISTORY: Past Surgical History:  Procedure Laterality Date   DENTAL SURGERY     NO PAST SURGERIES      MEDICATIONS: Current Outpatient Medications on File Prior to Visit  Medication Sig Dispense Refill   cetirizine (ZYRTEC) 10 MG tablet Take 10 mg by mouth daily.     Cholecalciferol (VITAMIN D-3) 1000 UNITS CAPS Take 1 capsule by mouth daily.     escitalopram (LEXAPRO) 5 MG tablet Take 5 mg by mouth daily.     HYDROcodone-acetaminophen (NORCO/VICODIN) 5-325 MG per tablet Take 2 tablets by mouth every 4 (four) hours as needed. 10 tablet 0   mometasone (ELOCON) 0.1 % cream Apply 1 application topically daily as needed.     ondansetron (ZOFRAN ODT) 4 MG disintegrating tablet Take 1 tablet (4 mg total) by mouth every 8 (eight) hours as needed for nausea or vomiting. 10 tablet 0   pantoprazole (PROTONIX) 40 MG tablet Take 1 tablet (40 mg total) by mouth daily. 30 tablet 0   vitamin E 100 UNIT capsule Take 100 Units by mouth daily.     No current facility-administered medications on file prior to visit.    ALLERGIES: Allergies   Allergen Reactions   Amoxicillin    Penicillins    Sulfa Antibiotics    Latex Itching and Rash    FAMILY HISTORY: Family History  Problem Relation Age of Onset   Bipolar disorder Mother    Other Father        back problems   Heart Problems Father    Migraines Maternal Grandmother    Migraines Paternal Grandmother     Objective:  *** General: No acute distress.  Patient appears well-groomed.   Head:  Normocephalic/atraumatic Eyes:  fundi examined but not visualized Neck: supple, mild paraspinal tenderness, full range of motion Heart: regular rate and rhythm Neurological Exam: Mental status: alert and oriented to person, place, and time, speech fluent and not dysarthric, language intact. Cranial nerves: CN I: not tested CN II: pupils equal, round and reactive to light, visual fields intact CN III, IV, VI:  full range of motion, no nystagmus, no ptosis CN V: facial sensation intact. CN VII: upper and lower face symmetric CN VIII: hearing intact CN IX, X: gag intact, uvula midline CN XI: sternocleidomastoid and trapezius muscles intact CN XII: tongue midline Bulk & Tone: normal, no fasciculations. Motor:  muscle strength 5/5 throughout Sensation:  Pinprick and vibratory sensation intact. Deep Tendon Reflexes:  2+ throughout,  toes downgoing.   Finger to nose testing:  Without dysmetria.   Heel to shin:  Without dysmetria.   Gait:  Normal station and stride.  Romberg negative.    Thank you for allowing me to take part in the care of this patient.  Shon Millet, DO  CC:  Aram Beecham, MD  Cristopher Peru, MD

## 2022-12-24 NOTE — Patient Instructions (Signed)
  Continue Ajovy every 4 weeks Take Ubrelvy at earliest onset of headache.  May repeat dose once in 2 hours if needed.  Maximum 2 tablets in 24 hours.  Let me know if it works or not Limit use of pain relievers to no more than 2 days out of the week.  These medications include acetaminophen, NSAIDs (ibuprofen/Advil/Motrin, naproxen/Aleve, triptans (Imitrex/sumatriptan), Excedrin, and narcotics.  This will help reduce risk of rebound headaches. Be aware of common food triggers Routine exercise Stay adequately hydrated (aim for 64 oz water daily) Keep headache diary Maintain proper stress management Maintain proper sleep hygiene Do not skip meals Consider supplements:  magnesium citrate 400mg  daily, riboflavin 400mg  daily, coenzyme Q10 300mg   daily.

## 2022-12-27 ENCOUNTER — Encounter: Payer: Self-pay | Admitting: Neurology

## 2023-03-04 ENCOUNTER — Other Ambulatory Visit: Payer: Self-pay | Admitting: Internal Medicine

## 2023-03-04 DIAGNOSIS — E782 Mixed hyperlipidemia: Secondary | ICD-10-CM

## 2023-03-04 DIAGNOSIS — R079 Chest pain, unspecified: Secondary | ICD-10-CM

## 2023-03-04 DIAGNOSIS — Q796 Ehlers-Danlos syndrome, unspecified: Secondary | ICD-10-CM

## 2023-03-04 DIAGNOSIS — R55 Syncope and collapse: Secondary | ICD-10-CM

## 2023-03-04 DIAGNOSIS — R002 Palpitations: Secondary | ICD-10-CM

## 2023-03-08 ENCOUNTER — Ambulatory Visit: Payer: BC Managed Care – PPO | Admitting: Neurology

## 2023-03-11 ENCOUNTER — Ambulatory Visit
Admission: RE | Admit: 2023-03-11 | Discharge: 2023-03-11 | Disposition: A | Discharge status: Home / Self Care | Payer: Self-pay | Source: Ambulatory Visit | Attending: Internal Medicine | Admitting: Internal Medicine

## 2023-03-11 DIAGNOSIS — E782 Mixed hyperlipidemia: Secondary | ICD-10-CM | POA: Insufficient documentation

## 2023-03-11 DIAGNOSIS — R002 Palpitations: Secondary | ICD-10-CM | POA: Insufficient documentation

## 2023-03-11 DIAGNOSIS — R079 Chest pain, unspecified: Secondary | ICD-10-CM | POA: Insufficient documentation

## 2023-03-11 DIAGNOSIS — R55 Syncope and collapse: Secondary | ICD-10-CM | POA: Insufficient documentation

## 2023-03-11 DIAGNOSIS — Q796 Ehlers-Danlos syndrome, unspecified: Secondary | ICD-10-CM | POA: Insufficient documentation

## 2023-07-08 ENCOUNTER — Ambulatory Visit: Payer: BC Managed Care – PPO | Admitting: Neurology

## 2023-07-19 ENCOUNTER — Other Ambulatory Visit: Payer: Self-pay | Admitting: Family Medicine

## 2023-07-19 DIAGNOSIS — R32 Unspecified urinary incontinence: Secondary | ICD-10-CM

## 2023-07-19 DIAGNOSIS — R350 Frequency of micturition: Secondary | ICD-10-CM

## 2023-07-19 DIAGNOSIS — R102 Pelvic and perineal pain: Secondary | ICD-10-CM

## 2023-07-29 ENCOUNTER — Ambulatory Visit
Admission: RE | Admit: 2023-07-29 | Discharge: 2023-07-29 | Disposition: A | Discharge status: Home / Self Care | Source: Ambulatory Visit | Attending: Family Medicine | Admitting: Family Medicine

## 2023-07-29 DIAGNOSIS — R102 Pelvic and perineal pain: Secondary | ICD-10-CM

## 2023-07-29 DIAGNOSIS — R32 Unspecified urinary incontinence: Secondary | ICD-10-CM

## 2023-07-29 DIAGNOSIS — R350 Frequency of micturition: Secondary | ICD-10-CM

## 2023-07-29 MED ORDER — IOPAMIDOL (ISOVUE-300) INJECTION 61%
100.0000 mL | Freq: Once | INTRAVENOUS | Status: AC | PRN
  Administered 2023-07-29: 100 mL via INTRAVENOUS

## 2023-08-23 ENCOUNTER — Other Ambulatory Visit: Payer: Self-pay

## 2023-08-23 DIAGNOSIS — R300 Dysuria: Secondary | ICD-10-CM

## 2023-08-23 DIAGNOSIS — R350 Frequency of micturition: Secondary | ICD-10-CM

## 2023-08-26 ENCOUNTER — Other Ambulatory Visit
Admission: RE | Admit: 2023-08-26 | Discharge: 2023-08-26 | Disposition: A | Discharge status: Home / Self Care | Attending: Urology | Admitting: Urology

## 2023-08-26 ENCOUNTER — Ambulatory Visit: Admitting: Urology

## 2023-08-26 VITALS — BP 137/81 | HR 69 | Ht 63.0 in | Wt 129.4 lb

## 2023-08-26 DIAGNOSIS — R350 Frequency of micturition: Secondary | ICD-10-CM

## 2023-08-26 DIAGNOSIS — M6289 Other specified disorders of muscle: Secondary | ICD-10-CM | POA: Diagnosis not present

## 2023-08-26 DIAGNOSIS — R300 Dysuria: Secondary | ICD-10-CM | POA: Insufficient documentation

## 2023-08-26 DIAGNOSIS — N879 Dysplasia of cervix uteri, unspecified: Secondary | ICD-10-CM | POA: Insufficient documentation

## 2023-08-26 LAB — URINALYSIS, COMPLETE (UACMP) WITH MICROSCOPIC
Bacteria, UA: NONE SEEN
Bilirubin Urine: NEGATIVE
Glucose, UA: NEGATIVE mg/dL
Hgb urine dipstick: NEGATIVE
Ketones, ur: NEGATIVE mg/dL
Leukocytes,Ua: NEGATIVE
Nitrite: NEGATIVE
Protein, ur: NEGATIVE mg/dL
Specific Gravity, Urine: 1.01 (ref 1.005–1.030)
WBC, UA: NONE SEEN WBC/hpf (ref 0–5)
pH: 6.5 (ref 5.0–8.0)

## 2023-08-26 LAB — BLADDER SCAN AMB NON-IMAGING: Scan Result: 8

## 2023-08-26 NOTE — Progress Notes (Signed)
 Elfrieda Grise Plume,acting as a scribe for Dustin Gimenez, MD.,have documented all relevant documentation on the behalf of Dustin Gimenez, MD,as directed by  Dustin Gimenez, MD while in the presence of Dustin Gimenez, MD.  08/26/23 3:40 PM   Avi Body 11-24-1995 [id number]  Referring provider: Lenell Query, PA-C [address] [place],  [place] [address]  Chief Complaint  Patient presents with   Urinary Frequency   Dysuria    HPI: 28 year old female with a personal history of HIV, Ehlers-Danlos syndrome, vesicoureteral reflux, and pelvic floor issues presents today for further evaluation of urinary frequency.  She reports several months of urinary urgency and frequency, beginning around October-November. She describes feeling the need to urinate again immediately after voiding, occurring approximately 80% of the time. She also experiences difficulty fully emptying her bladder, sometimes needing to stand to void completely. Additionally, she reports episodes of stress incontinence, such as leaking urine when coughing, sneezing, or during physical activities like stretching in a workout class.   She has a history of pelvic floor issues and underwent pelvic floor rehabilitation at age 81 due to vaginismus, which involved painful internal myofascial release and the use of dilators.   She has not been sexually active recently, with the last encounter being a year ago. She underwent a LEEP procedure recently and noted that her urinary symptoms began shortly after.   A recent pelvic CT showed inflammation in her uterus, and she is scheduled to see her gynecologist next week.   She denies constipation and diarrhea.   Her urinalysis today is negative, and she has been treated empirically for urinary tract infections in the past despite negative urinalyses.   She has been on doxycycline recently for perioral dermatitis.   She reports sensitivity to vaginal suppositories  and has been taking probiotics, although they have been irritating her stomach.   Results for orders placed or performed in visit on 08/26/23  Bladder Scan (Post Void Residual) in office  Result Value Ref Range   Scan Result 8 ml   Results for orders placed or performed during the hospital encounter of 08/26/23  Urinalysis, Complete w Microscopic -  Result Value Ref Range   Color, Urine STRAW (A) YELLOW   APPearance CLEAR CLEAR   Specific Gravity, Urine 1.010 1.005 - 1.030   pH 6.5 5.0 - 8.0   Glucose, UA NEGATIVE NEGATIVE mg/dL   Hgb urine dipstick NEGATIVE NEGATIVE   Bilirubin Urine NEGATIVE NEGATIVE   Ketones, ur NEGATIVE NEGATIVE mg/dL   Protein, ur NEGATIVE NEGATIVE mg/dL   Nitrite NEGATIVE NEGATIVE   Leukocytes,Ua NEGATIVE NEGATIVE   Squamous Epithelial / HPF 0-5 0 - 5 /HPF   WBC, UA NONE SEEN 0 - 5 WBC/hpf   RBC / HPF 0-5 0 - 5 RBC/hpf   Bacteria, UA NONE SEEN NONE SEEN     PMH: Past Medical History:  Diagnosis Date   Atopic dermatitis    Headache    Interstitial cystitis    Pelvic floor dysfunction     Surgical History: Past Surgical History:  Procedure Laterality Date   DENTAL SURGERY     NO PAST SURGERIES      Home Medications:  Allergies as of 08/26/2023       Reactions   Clindamycin Nausea And Vomiting, Nausea Only, Other (See Comments)   unknown   Nickel Dermatitis, Other (See Comments), Swelling   Per patient redness, swelling and drg  Per patient redness, swelling and drg Per  patient redness, swelling and drg  Per patient redness, swelling and drg  Per patient redness, swelling and drg  Per patient redness, swelling and drg Per patient redness, swelling and drg    Per patient redness, swelling and drg  Per patient redness, swelling and drg Per patient redness, swelling and drg Per patient redness, swelling and drg Per patient redness, swelling and drg Per patient redness, swelling and drg Per patient redness, swelling and drg Per patient  redness, swelling and drg Per patient redness, swelling and drg Per patient redness, swelling and drg Per patient redness, swelling and drg Per patient redness, swelling and drg    Per patient redness, swelling and drg Per patient redness, swelling and drg Per patient redness, swelling and drg Per patient redness, swelling and drg Per patient redness, swelling and drg Per patient redness, swelling and drg Per patient redness, swelling and drg Per patient redness, swelling and drg Per patient redness, swelling and drg Per patient redness, swelling and drg    Per patient redness, swelling and drg, Per patient redness, swelling and drg, Per patient redness, swelling and drg, Per patient redness, swelling and drg Per patient redness, swelling and drg, Per patient redness, swelling and drg, Per patient redness, swelling and drg, Per patient redness, swelling and drg   Quinolones Other (See Comments)   Other   Amoxicillin    Penicillins    Sulfa Antibiotics    Latex Itching, Rash        Medication List        Accurate as of August 26, 2023  3:40 PM. If you have any questions, ask your nurse or doctor.          Ajovy 225 MG/1.5ML Soaj Generic drug: Fremanezumab-vfrm monthly   Biktarvy 50-200-25 MG Tabs tablet Generic drug: bictegravir-emtricitabine-tenofovir AF Take 1 tablet by mouth daily.   Junel FE 1.5/30 1.5-30 MG-MCG tablet Generic drug: norethindrone-ethinyl estradiol-iron Take 1 tablet by mouth daily.   lamoTRIgine 25 MG tablet Commonly known as: LAMICTAL Take 2 tablets by mouth every morning.   levocetirizine 5 MG tablet Commonly known as: XYZAL Take 2.5 mg by mouth every evening.   magnesium oxide 400 MG tablet Commonly known as: MAG-OX Take 1 tablet by mouth daily.   montelukast 10 MG tablet Commonly known as: SINGULAIR Take 10 mg by mouth at bedtime.   sertraline 50 MG tablet Commonly known as: ZOLOFT Take 50 mg by mouth daily.   Vitamin D-3 25 MCG (1000  UT) Caps Take 1 capsule by mouth daily.        Allergies:  Allergies  Allergen Reactions   Clindamycin Nausea And Vomiting, Nausea Only and Other (See Comments)    unknown   Nickel Dermatitis, Other (See Comments) and Swelling    Per patient redness, swelling and drg  Per patient redness, swelling and drg  Per patient redness, swelling and drg  Per patient redness, swelling and drg  Per patient redness, swelling and drg  Per patient redness, swelling and drg  Per patient redness, swelling and drg    Per patient redness, swelling and drg  Per patient redness, swelling and drg Per patient redness, swelling and drg Per patient redness, swelling and drg Per patient redness, swelling and drg Per patient redness, swelling and drg Per patient redness, swelling and drg Per patient redness, swelling and drg Per patient redness, swelling and drg Per patient redness, swelling and drg Per patient redness, swelling and drg  Per patient redness,  swelling and drg    Per patient redness, swelling and drg Per patient redness, swelling and drg Per patient redness, swelling and drg Per patient redness, swelling and drg Per patient redness, swelling and drg Per patient redness, swelling and drg Per patient redness, swelling and drg Per patient redness, swelling and drg Per patient redness, swelling and drg Per patient redness, swelling and drg    Per patient redness, swelling and drg, Per patient redness, swelling and drg, Per patient redness, swelling and drg, Per patient redness, swelling and drg  Per patient redness, swelling and drg, Per patient redness, swelling and drg, Per patient redness, swelling and drg, Per patient redness, swelling and drg   Quinolones Other (See Comments)    Other   Amoxicillin    Penicillins    Sulfa Antibiotics    Latex Itching and Rash    Family History: Family History  Problem Relation Age of Onset   Bipolar disorder Mother    Other Father        back  problems   Heart Problems Father    Migraines Maternal Grandmother    Migraines Paternal Grandmother     Social History:  reports that she has never smoked. She has never used smokeless tobacco. She reports current alcohol use. She reports that she does not use drugs.   Physical Exam: BP 137/81   Pulse 69   Ht 5' 3 (1.6 m)   Wt 129 lb 6 oz (58.7 kg)   LMP 07/05/2023 (Exact Date)   BMI 22.92 kg/m   Constitutional:  Alert and oriented, No acute distress. HEENT: [place] AT, moist mucus membranes.  Trachea midline, no masses. Neurologic: Grossly intact, no focal deficits, moving all 4 extremities. Psychiatric: Normal mood and affect.   Assessment & Plan:    1. Pelvic Floor Dysfunction - Her symptoms of urinary frequency, urgency, and stress incontinence are likely related to pelvic floor dysfunction, possibly exacerbated by recent procedures and her connective tissue disorder. - Referral to pelvic floor physical therapy is recommended to address muscle coordination and strengthen the pelvic floor. She prefers a location in [place], with an alternative option in [place] if needed.  2. Stress Urinary Incontinence - Stress incontinence is noted, likely related to pelvic floor dysfunction and connective tissue disorder. - Pelvic floor therapy is the initial management step. Surgical options such as urethral bulking agents or sling procedures may be considered if conservative management fails and symptoms significantly impact quality of life.  3. Uterine Inflammation - She is scheduled to see her gynecologist next week for further evaluation of uterine inflammation noted on pelvic CT.  4. Probiotic Use - She is advised to continue taking probiotics to support gut flora, especially after recent antibiotic use for perioral dermatitis.  F/u prn  I have reviewed the above documentation for accuracy and completeness, and I agree with the above.   Dustin Gimenez, MD    [facility]  [facility] [address] [place], [place] [address] [phone]
